# Patient Record
Sex: Female | Born: 1985
Health system: Southern US, Community
[De-identification: ages and names within clinical notes are randomized; demographics above are authoritative.]

## PROBLEM LIST (undated history)

## (undated) ENCOUNTER — Inpatient Hospital Stay: Payer: Self-pay

## (undated) DIAGNOSIS — Z87442 Personal history of urinary calculi: Secondary | ICD-10-CM

## (undated) DIAGNOSIS — F329 Major depressive disorder, single episode, unspecified: Secondary | ICD-10-CM

## (undated) DIAGNOSIS — D649 Anemia, unspecified: Secondary | ICD-10-CM

## (undated) DIAGNOSIS — K219 Gastro-esophageal reflux disease without esophagitis: Secondary | ICD-10-CM

## (undated) DIAGNOSIS — F419 Anxiety disorder, unspecified: Secondary | ICD-10-CM

## (undated) DIAGNOSIS — F32A Depression, unspecified: Secondary | ICD-10-CM

## (undated) HISTORY — PX: CHOLECYSTECTOMY, LAPAROSCOPIC: SHX56

## (undated) HISTORY — DX: Depression, unspecified: F32.A

## (undated) HISTORY — DX: Major depressive disorder, single episode, unspecified: F32.9

## (undated) HISTORY — DX: Gastro-esophageal reflux disease without esophagitis: K21.9

---
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
## 2006-03-24 ENCOUNTER — Observation Stay: Payer: Self-pay

## 2006-03-24 ENCOUNTER — Ambulatory Visit: Payer: Self-pay | Admitting: Family Medicine

## 2006-03-26 ENCOUNTER — Observation Stay: Payer: Self-pay

## 2006-03-28 ENCOUNTER — Observation Stay: Payer: Self-pay | Admitting: Obstetrics and Gynecology

## 2006-03-31 ENCOUNTER — Observation Stay: Payer: Self-pay

## 2006-04-03 ENCOUNTER — Observation Stay: Payer: Self-pay

## 2006-04-06 ENCOUNTER — Inpatient Hospital Stay: Payer: Self-pay

## 2009-02-06 ENCOUNTER — Emergency Department: Payer: Self-pay | Admitting: Emergency Medicine

## 2009-08-19 ENCOUNTER — Ambulatory Visit: Payer: Self-pay | Admitting: Family Medicine

## 2010-11-24 ENCOUNTER — Emergency Department: Payer: Self-pay | Admitting: Emergency Medicine

## 2012-03-04 ENCOUNTER — Emergency Department: Payer: Self-pay | Admitting: Emergency Medicine

## 2012-03-04 LAB — BASIC METABOLIC PANEL
Calcium, Total: 8.6 mg/dL (ref 8.5–10.1)
Chloride: 106 mmol/L (ref 98–107)
Co2: 26 mmol/L (ref 21–32)
EGFR (Non-African Amer.): >60
Osmolality: 276 (ref 275–301)
Potassium: 3.7 mmol/L (ref 3.5–5.1)

## 2012-03-04 LAB — CBC
HGB: 13 g/dL (ref 12.0–16.0)
MCH: 32.2 pg (ref 26.0–34.0)
MCHC: 34.2 g/dL (ref 32.0–36.0)
MCV: 94 fL (ref 80–100)
Platelet: 241 10*3/uL (ref 150–440)
RBC: 4.04 10*6/uL (ref 3.80–5.20)

## 2012-03-04 LAB — CK TOTAL AND CKMB (NOT AT ARMC)
CK, Total: 59 U/L (ref 21–215)
CK-MB: 0.7 ng/mL (ref 0.5–3.6)

## 2012-03-05 LAB — CK TOTAL AND CKMB (NOT AT ARMC)
CK, Total: 48 U/L (ref 21–215)
CK-MB: 0.7 ng/mL (ref 0.5–3.6)

## 2012-03-14 ENCOUNTER — Ambulatory Visit: Payer: Self-pay | Admitting: Family Medicine

## 2013-06-25 ENCOUNTER — Ambulatory Visit: Payer: Self-pay | Admitting: Physician Assistant

## 2014-04-04 NOTE — L&D Delivery Note (Signed)
Operative Delivery Note At 12:40 AM a viable female was delivered via C-Section, Low Vertical.  Presentation: vertex; Position:  Verbal consent: obtained from patient.  Risks and benefits discussed in detail.  Risks include, but are not limited to the risks of anesthesia, bleeding, infection, damage to maternal tissues, fetal cephalhematoma.  There is also the risk of inability to effect vaginal delivery of the head, or shoulder dystocia that cannot be resolved by established maneuvers, leading to the need for emergency cesarean section.  APGAR: 8, 9; weight 6 lb 12.6 oz (3080 g).   Placenta status: Intact, Manual removal.   Cord: 3 vessels with the following complications: None.  Cord pH: not done  Anesthesia: Spinal  Instruments:Episiotomy: None Lacerations: None Suture Repair: chromic vicryl Est. Blood Loss (mL): 500 IOF 1100 cc   Mom to postpartum.  Baby to to peds .  Talon Witting 02/03/2015, 1:03 AM

## 2014-07-02 ENCOUNTER — Emergency Department
Admit: 2014-07-02 | Disposition: A | Discharge status: Home / Self Care | Payer: Self-pay | Admitting: Emergency Medicine

## 2014-07-02 LAB — URINALYSIS, COMPLETE
Bilirubin,UR: NEGATIVE
Blood: NEGATIVE
Glucose,UR: NEGATIVE mg/dL (ref 0–75)
Ketone: NEGATIVE
LEUKOCYTE ESTERASE: NEGATIVE
Nitrite: NEGATIVE
Ph: 6 (ref 4.5–8.0)
Protein: NEGATIVE
Specific Gravity: 1.017 (ref 1.003–1.030)
Squamous Epithelial: 2

## 2014-07-02 LAB — COMPREHENSIVE METABOLIC PANEL
ALT: 14 U/L
Albumin: 3.8 g/dL
Alkaline Phosphatase: 49 U/L
Anion Gap: 6 — ABNORMAL LOW (ref 7–16)
BUN: 10 mg/dL
Bilirubin,Total: 0.1 mg/dL — ABNORMAL LOW
CHLORIDE: 103 mmol/L
CO2: 27 mmol/L
Calcium, Total: 9.2 mg/dL
Creatinine: 0.53 mg/dL
GLUCOSE: 118 mg/dL — AB
Potassium: 3.6 mmol/L
SGOT(AST): 16 U/L
Sodium: 136 mmol/L
Total Protein: 7.1 g/dL

## 2014-07-02 LAB — CBC WITH DIFFERENTIAL/PLATELET
BASOS ABS: 0 10*3/uL (ref 0.0–0.1)
Basophil %: 0.5 %
EOS ABS: 0.1 10*3/uL (ref 0.0–0.7)
Eosinophil %: 0.8 %
HCT: 37.9 % (ref 35.0–47.0)
HGB: 12.6 g/dL (ref 12.0–16.0)
LYMPHS ABS: 2.1 10*3/uL (ref 1.0–3.6)
LYMPHS PCT: 22.4 %
MCH: 31.4 pg (ref 26.0–34.0)
MCHC: 33.4 g/dL (ref 32.0–36.0)
MCV: 94 fL (ref 80–100)
Monocyte #: 0.6 x10 3/mm (ref 0.2–0.9)
Monocyte %: 6.7 %
NEUTROS ABS: 6.6 10*3/uL — AB (ref 1.4–6.5)
Neutrophil %: 69.6 %
Platelet: 348 10*3/uL (ref 150–440)
RBC: 4.02 10*6/uL (ref 3.80–5.20)
RDW: 12.3 % (ref 11.5–14.5)
WBC: 9.5 10*3/uL (ref 3.6–11.0)

## 2014-07-02 LAB — HCG, QUANTITATIVE, PREGNANCY: Beta Hcg, Quant.: 25350 m[IU]/mL — ABNORMAL HIGH

## 2014-12-04 ENCOUNTER — Inpatient Hospital Stay
Admission: EM | Admit: 2014-12-04 | Discharge: 2014-12-04 | Disposition: A | Discharge status: Home / Self Care | Payer: Self-pay | Attending: Obstetrics and Gynecology | Admitting: Obstetrics and Gynecology

## 2014-12-04 DIAGNOSIS — Z3A28 28 weeks gestation of pregnancy: Secondary | ICD-10-CM | POA: Insufficient documentation

## 2014-12-04 DIAGNOSIS — O26893 Other specified pregnancy related conditions, third trimester: Secondary | ICD-10-CM | POA: Insufficient documentation

## 2014-12-04 DIAGNOSIS — O368130 Decreased fetal movements, third trimester, not applicable or unspecified: Secondary | ICD-10-CM | POA: Insufficient documentation

## 2014-12-04 DIAGNOSIS — R109 Unspecified abdominal pain: Secondary | ICD-10-CM | POA: Insufficient documentation

## 2014-12-04 HISTORY — DX: Anxiety disorder, unspecified: F41.9

## 2014-12-04 LAB — URINALYSIS COMPLETE WITH MICROSCOPIC (ARMC ONLY)
BACTERIA UA: NONE SEEN
Bilirubin Urine: NEGATIVE
Glucose, UA: NEGATIVE mg/dL
Hgb urine dipstick: NEGATIVE
KETONES UR: NEGATIVE mg/dL
Leukocytes, UA: NEGATIVE
Nitrite: NEGATIVE
PH: 6 (ref 5.0–8.0)
PROTEIN: NEGATIVE mg/dL
Specific Gravity, Urine: 1.008 (ref 1.005–1.030)

## 2014-12-04 NOTE — OB Triage Note (Signed)
Pt presents to L&D with c/o tightness in abd. Occasional cramping, and back pain and decreased fetakl movement

## 2014-12-04 NOTE — OB Triage Provider Note (Signed)
History     CSN: [id number]  Arrival date and time: 12/04/14 1905   None    HPI:   29 yo G2P1 Pt. 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  Reports occasional low back pain with intermittent cramping. Pt. Is a previous c/s x 1 in 2021for breech presentation.   OB History    Gravida Para Term Preterm AB TAB SAB Ectopic Multiple Living   2         1     Past Surgical History  Procedure Laterality Date  . Cesarean section - breech presentation      History reviewed. No pertinent family history.  Social History  Substance Use Topics  . Smoking status: Not on file  . Smokeless tobacco: Not on file  . Alcohol Use: Not on file   Allergies  Allergen Reactions  . Latex Rash  . Nickel Rash    No prescriptions prior to admission    ROS: General: Denies weight loss, weight gain, fevers, fatigue Neuro: Denies HA, occasional floaters (an ongoing problem, but not bothersome or worsening), denies dizziness or blackouts Pulm: denies SOB, cough, wheezing Heart: denies chest pain, heart palpitations  Abd: + constant tightness x 1 day, denies epigastric pain, nausea, vomiting, diarrhea, +constipation - relieved with Benefiber Back: occasional low back pain GU: denies dysuria, frequency, urgency, +occasional difficulty starting stream Pelvic: denies vaginal bleeding, LOF, abnormal discharge Physical Exam   Filed Vitals:   12/04/14 1926  BP: 120/67  Pulse: 86  Temp: 97.7 F (36.5 C)  Resp: 18   Physical Exam  General: alert, calm, NAD Lungs: clear, equal bilaterally Heart: RRR Abdomen: soft, + slight tenderness RUQ with deep palpation, gravid SVE: external os FT/ internal os closed / thick / soft / mid position  Fetal monitoring: Baseline: 135bmp, moderate variability, +10x10 accels,  no decelerations Toco: occasional ctxs with uterine irritability   MAU Course  Procedures  Results for Hannah Snow, Hannah Snow (MRN [id number]) as of 12/04/2014 20:41  Ref. Range 12/04/2014 19:49  Appearance Latest Ref Range: CLEAR  CLEAR (A)  Bacteria, UA Latest Ref Range: NONE SEEN  NONE SEEN  Bilirubin Urine Latest Ref Range: NEGATIVE  NEGATIVE  Color, Urine Latest Ref Range: YELLOW  STRAW (A)  Glucose Latest Ref Range: NEGATIVE mg/dL NEGATIVE  Hgb urine dipstick Latest Ref Range: NEGATIVE  NEGATIVE  Ketones, ur Latest Ref Range: NEGATIVE mg/dL NEGATIVE  Leukocytes, UA Latest Ref Range: NEGATIVE  NEGATIVE  Mucous Unknown PRESENT  Nitrite Latest Ref Range: NEGATIVE  NEGATIVE  pH Latest Ref Range: 5.0-8.0  6.0  Protein Latest Ref Range: NEGATIVE mg/dL NEGATIVE  RBC / HPF Latest Ref Range: 0-5 RBC/hpf 0-5  Specific Gravity, Urine Latest Ref Range: 1.005-1.030  1.008  Squamous Epithelial / LPF Latest Ref Range: NONE SEEN  0-5 (A)  WBC, UA Latest Ref Range: 0-5 WBC/hpf 0-5    Assessment and Plan  IUP at 28+2weeks Category 1 fetal monitoring Tracing Irregular Contractions UA sent Prolonged fetal monitoring  Increase hydration   2126pm:  Uterine irritability and contractions resolved with hydration Fetal movement increased with meal UA - neg Category 1 Fetal Tracing Increase hydration Fetal kick counts daily 6 small frequent meals per day with protein snacks Labor s/s reviewed Instructed to call if worsening symptoms F/U  in office tomorrow for lab work and keep appointment on 12/26/14 at [facility]  Discussed plan of care with Dr. Feliberto Gottron. He agrees with plan.    Karena Addison 12/04/2014, 7:30 PM

## 2014-12-04 NOTE — Discharge Instructions (Signed)
Call provider or return to birthplace with: ° °1. Regular contractions °2. Leaking of fluid from your vaginal °3. Vaginal bleeding: Bright red or heavy like a period °4. Decreased Fetal movement ° °

## 2014-12-15 LAB — OB RESULTS CONSOLE ABO/RH

## 2015-01-26 ENCOUNTER — Observation Stay
Admission: EM | Admit: 2015-01-26 | Discharge: 2015-01-26 | Disposition: A | Discharge status: Home / Self Care | Payer: PRIVATE HEALTH INSURANCE | Attending: Obstetrics and Gynecology | Admitting: Obstetrics and Gynecology

## 2015-01-26 ENCOUNTER — Encounter: Payer: Self-pay | Admitting: *Deleted

## 2015-01-26 DIAGNOSIS — Z87891 Personal history of nicotine dependence: Secondary | ICD-10-CM | POA: Diagnosis not present

## 2015-01-26 DIAGNOSIS — Z3A35 35 weeks gestation of pregnancy: Secondary | ICD-10-CM | POA: Diagnosis not present

## 2015-01-26 DIAGNOSIS — Z91040 Latex allergy status: Secondary | ICD-10-CM | POA: Diagnosis not present

## 2015-01-26 DIAGNOSIS — Z91048 Other nonmedicinal substance allergy status: Secondary | ICD-10-CM | POA: Insufficient documentation

## 2015-01-26 DIAGNOSIS — O4703 False labor before 37 completed weeks of gestation, third trimester: Principal | ICD-10-CM | POA: Insufficient documentation

## 2015-01-26 NOTE — OB Triage Note (Signed)
Recvd to LDR 1 from ED per wheelchair.  Changed to gown and to bed.  C/O contractions that started 1500 today.  Also complains of back pain with UCs.  EFM applied.  Discussed plan of care and oriented to room.  Verbalized understanding.  Agrees with plan of care.

## 2015-01-26 NOTE — OB Triage Provider Note (Signed)
29 yo G2P1002 with EDD of 02/24/15 by LMP and correlates with 20 week US. PNC signficant for Rubella non-immune, RCS planned [facility] "UC's starting at 3pm today and pt concerned that she may be in PTL. No ROM, no VB, No increase in UC's, no LOF or decreased fM. Past Medical History  Diagnosis Date  . Anxiety   Panic attacks Past Surgical History  Procedure Laterality Date  . Cesarean section    FMH: MGM: Type 2 DM, HTN,CAD, Breast cancer PGM: CAD, HTN, DM Type 2, Breast cancer Cervical Cancer: Pat aunt, ovarian Cancer: Pat aunt Allergies  Allergen Reactions  . Latex Rash  . Nickel Rash   OB History    Gravida Para Term Preterm AB TAB SAB Ectopic Multiple Living   2         1    Gynecology: no abnormal paps Social History   Social History  . Marital Status: Single    Spouse Name: N/A  . Number of Children: N/A  . Years of Education: N/A   Occupational History  . Not on file.   Social History Main Topics  . Smoking status: Former Smoker    Quit date: 07/07/2014  . Smokeless tobacco: Not on file  . Alcohol Use: Not on file  . Drug Use: Not on file  . Sexual Activity: Yes   Other Topics Concern  . Not on file   Social History Narrative  Genetic: No Down's, no trisomies or any other genetic defects in family. O:Gen: 29 yo white female in NAD HEENT: Eyes non-icteric, Speech: clear. Heart: S1S2, RRR, No M/R/G. Lungs: CTA bilat, no W./R/R. Abd: Gravid, NT to palp. FHT: 140, reactive NST with 2 accels 15 x 15 BPM TOCO: UC's q 12-15 mins Cx: 1/long and thick/post/very high NO evidence of leaking or Vag bleeding. A: IUP at 35 6/7 weeks 2. Threatened PTL  P: Reassure pt that she is having B_H's. Rest and avoid sex for the next few days 3. FU [facility] 4. [facility]KC's A:

## 2015-02-02 ENCOUNTER — Inpatient Hospital Stay
Admission: EM | Admit: 2015-02-02 | Discharge: 2015-02-06 | DRG: 766 | Disposition: A | Discharge status: Home / Self Care | Payer: PRIVATE HEALTH INSURANCE | Attending: Obstetrics and Gynecology | Admitting: Obstetrics and Gynecology

## 2015-02-02 ENCOUNTER — Encounter
Admission: EM | Disposition: A | Discharge status: Home / Self Care | Payer: Self-pay | Source: Home / Self Care | Attending: Obstetrics and Gynecology

## 2015-02-02 DIAGNOSIS — O34211 Maternal care for low transverse scar from previous cesarean delivery: Secondary | ICD-10-CM | POA: Diagnosis present

## 2015-02-02 DIAGNOSIS — Z87891 Personal history of nicotine dependence: Secondary | ICD-10-CM | POA: Diagnosis not present

## 2015-02-02 DIAGNOSIS — Z3A36 36 weeks gestation of pregnancy: Secondary | ICD-10-CM | POA: Diagnosis not present

## 2015-02-02 DIAGNOSIS — O42913 Preterm premature rupture of membranes, unspecified as to length of time between rupture and onset of labor, third trimester: Principal | ICD-10-CM | POA: Diagnosis present

## 2015-02-02 DIAGNOSIS — Z9889 Other specified postprocedural states: Secondary | ICD-10-CM

## 2015-02-02 DIAGNOSIS — O42112 Preterm premature rupture of membranes, onset of labor more than 24 hours following rupture, second trimester: Secondary | ICD-10-CM | POA: Diagnosis present

## 2015-02-02 LAB — CBC
HEMATOCRIT: 32.9 % — AB (ref 35.0–47.0)
Hemoglobin: 11.3 g/dL — ABNORMAL LOW (ref 12.0–16.0)
MCH: 33.1 pg (ref 26.0–34.0)
MCHC: 34.4 g/dL (ref 32.0–36.0)
MCV: 96.4 fL (ref 80.0–100.0)
PLATELETS: 232 10*3/uL (ref 150–440)
RBC: 3.41 MIL/uL — AB (ref 3.80–5.20)
RDW: 13 % (ref 11.5–14.5)
WBC: 10 10*3/uL (ref 3.6–11.0)

## 2015-02-02 LAB — OB RESULTS CONSOLE VARICELLA ZOSTER ANTIBODY, IGG: VARICELLA IGG: IMMUNE

## 2015-02-02 LAB — OB RESULTS CONSOLE GBS: STREP GROUP B AG: NEGATIVE

## 2015-02-02 LAB — OB RESULTS CONSOLE ANTIBODY SCREEN: ANTIBODY SCREEN: NEGATIVE

## 2015-02-02 LAB — OB RESULTS CONSOLE ABO/RH: RH TYPE: NEGATIVE

## 2015-02-02 LAB — OB RESULTS CONSOLE HIV ANTIBODY (ROUTINE TESTING): HIV: NONREACTIVE

## 2015-02-02 LAB — OB RESULTS CONSOLE RPR: RPR: NONREACTIVE

## 2015-02-02 LAB — OB RESULTS CONSOLE GC/CHLAMYDIA
Chlamydia: NEGATIVE
Gonorrhea: NEGATIVE

## 2015-02-02 LAB — OB RESULTS CONSOLE RUBELLA ANTIBODY, IGM: Rubella: IMMUNE

## 2015-02-02 SURGERY — Surgical Case
Anesthesia: Spinal | Site: Abdomen | Wound class: Clean Contaminated

## 2015-02-02 MED ORDER — BUTORPHANOL TARTRATE 1 MG/ML IJ SOLN: 1.0000 mg | INTRAMUSCULAR | Status: DC | PRN

## 2015-02-02 MED ORDER — ONDANSETRON HCL 4 MG/2ML IJ SOLN
4.0000 mg | Freq: Four times a day (QID) | INTRAMUSCULAR | Status: DC | PRN
  Administered 2015-02-03: 4 mg via INTRAVENOUS

## 2015-02-02 MED ORDER — CITRIC ACID-SODIUM CITRATE 334-500 MG/5ML PO SOLN
30.0000 mL | ORAL | Status: DC | PRN
  Administered 2015-02-03: 30 mL via ORAL
  Filled 2015-02-02: qty 15

## 2015-02-02 MED ORDER — LACTATED RINGERS IV SOLN
INTRAVENOUS | Status: DC
  Administered 2015-02-02: via INTRAVENOUS

## 2015-02-02 MED ORDER — ACETAMINOPHEN 325 MG PO TABS: 650.0000 mg | ORAL_TABLET | ORAL | Status: DC | PRN

## 2015-02-02 MED ORDER — CEFAZOLIN SODIUM-DEXTROSE 2-3 GM-% IV SOLR
2.0000 g | Freq: Once | INTRAVENOUS | Status: AC
  Administered 2015-02-03: 2 g via INTRAVENOUS
  Filled 2015-02-02: qty 50

## 2015-02-02 MED ORDER — LIDOCAINE HCL (PF) 1 % IJ SOLN: 30.0000 mL | INTRAMUSCULAR | Status: DC | PRN

## 2015-02-02 MED ORDER — OXYTOCIN BOLUS FROM INFUSION: 500.0000 mL | INTRAVENOUS | Status: DC

## 2015-02-02 MED ORDER — LACTATED RINGERS IV SOLN
500.0000 mL | INTRAVENOUS | Status: DC | PRN
  Administered 2015-02-02: 1000 mL via INTRAVENOUS
  Administered 2015-02-03: via INTRAVENOUS

## 2015-02-02 MED ORDER — OXYTOCIN 40 UNITS IN LACTATED RINGERS INFUSION - SIMPLE MED
62.5000 mL/h | INTRAVENOUS | Status: DC
  Administered 2015-02-03: 300 mL via INTRAVENOUS
  Filled 2015-02-02: qty 1000

## 2015-02-02 SURGICAL SUPPLY — 22 items
BARRIER ADHS 3X4 INTERCEED (GAUZE/BANDAGES/DRESSINGS) ×3 IMPLANT
CANISTER SUCT 3000ML (MISCELLANEOUS) ×3 IMPLANT
CATH KIT ON-Q SILVERSOAK 5IN (CATHETERS) ×3 IMPLANT
CHLORAPREP W/TINT 26ML (MISCELLANEOUS) ×3 IMPLANT
DRSG TELFA 3X8 NADH (GAUZE/BANDAGES/DRESSINGS) ×3 IMPLANT
ELECT CAUTERY BLADE 6.4 (BLADE) ×3 IMPLANT
GAUZE SPONGE 4X4 12PLY STRL (GAUZE/BANDAGES/DRESSINGS) ×3 IMPLANT
GLOVE BIO SURGEON STRL SZ8 (GLOVE) ×3 IMPLANT
GOWN STRL REUS W/ TWL LRG LVL3 (GOWN DISPOSABLE) ×2 IMPLANT
GOWN STRL REUS W/ TWL XL LVL3 (GOWN DISPOSABLE) ×1 IMPLANT
GOWN STRL REUS W/TWL LRG LVL3 (GOWN DISPOSABLE) ×4
GOWN STRL REUS W/TWL XL LVL3 (GOWN DISPOSABLE) ×2
NS IRRIG 1000ML POUR BTL (IV SOLUTION) ×3 IMPLANT
PACK C SECTION AR (MISCELLANEOUS) ×3 IMPLANT
PAD GROUND ADULT SPLIT (MISCELLANEOUS) ×3 IMPLANT
PAD OB MATERNITY 4.3X12.25 (PERSONAL CARE ITEMS) ×3 IMPLANT
PAD PREP 24X41 OB/GYN DISP (PERSONAL CARE ITEMS) ×3 IMPLANT
SPONGE LAP 18X18 5 PK (GAUZE/BANDAGES/DRESSINGS) ×3 IMPLANT
STRAP SAFETY BODY (MISCELLANEOUS) ×3 IMPLANT
SUT CHROMIC 1 CTX 36 (SUTURE) ×9 IMPLANT
SUT PLAIN GUT 0 (SUTURE) ×6 IMPLANT
SUT VIC AB 0 CT1 36 (SUTURE) ×6 IMPLANT

## 2015-02-02 NOTE — H&P (Signed)
Hannah Snow is a 29 y.o. female presenting for PPROM at 2130 tonight . EDC 02/25/15 , EGA 36+5 weeks . Previous c/s and desires repeat . History OB History    Gravida Para Term Preterm AB TAB SAB Ectopic Multiple Living   2 1 0 0 0 0 0 0 0 1      Past Medical History  Diagnosis Date  . Anxiety    Past Surgical History  Procedure Laterality Date  . Cesarean section     1Family History: family history is not on file. Social History:  reports that she quit smoking about 6 months ago. She does not have any smokeless tobacco history on file. Her alcohol and drug histories are not on file.   Prenatal Transfer Tool  Maternal Diabetes: No Genetic Screening: Declined Maternal Ultrasounds/Referrals: Normal Fetal Ultrasounds or other Referrals:  None Maternal Substance Abuse:  No Significant Maternal Medications:  None Significant Maternal Lab Results:  None Other Comments:  None  ROS    There were no vitals taken for this visit. VSS Exam lungs cta  cv RRR without murmur  Abd: gravid soft NT  Sterile spec : ++ pooling + ferning + nitrazine  Physical Exam  Prenatal labs: ABO, Rh:  A neg Antibody:  neg  Rubella:  non immune RPR:   neg HBsAg:   neg  HIV:   neg GBS:   neg   Assessment/Plan: PPROM 36+5 weeks , prior c/s  ADmit and proceed to repeat c/s . Risks of the procedure discussed with the pt . All questions answered  She declines BTL  Declines On Q pump    Srija Southard 02/02/2015, 10:48 PM

## 2015-02-02 NOTE — Anesthesia Preprocedure Evaluation (Signed)
Anesthesia Evaluation  Patient identified by MRN, date of birth, ID band Patient awake    Reviewed: Allergy & Precautions, NPO status , Patient's Chart, lab work & pertinent test results  Airway Mallampati: II       Dental no notable dental hx.    Pulmonary former smoker,    Pulmonary exam normal        Cardiovascular negative cardio ROS Normal cardiovascular exam     Neuro/Psych    GI/Hepatic negative GI ROS, Neg liver ROS, Full stomach!   Endo/Other    Renal/GU negative Renal ROSKidney Stones  negative genitourinary   Musculoskeletal negative musculoskeletal ROS (+)   Abdominal Normal abdominal exam  (+)   Peds negative pediatric ROS (+)  Hematology negative hematology ROS (+) anemia ,   Anesthesia Other Findings   Reproductive/Obstetrics negative OB ROS                             Anesthesia Physical Anesthesia Plan  ASA: II and emergent  Anesthesia Plan: Spinal   Post-op Pain Management:    Induction:   Airway Management Planned: Nasal Cannula  Additional Equipment:   Intra-op Plan:   Post-operative Plan:   Informed Consent: I have reviewed the patients History and Physical, chart, labs and discussed the procedure including the risks, benefits and alternatives for the proposed anesthesia with the patient or authorized representative who has indicated his/her understanding and acceptance.     Plan Discussed with: CRNA  Anesthesia Plan Comments:         Anesthesia Quick Evaluation

## 2015-02-03 ENCOUNTER — Encounter: Payer: Self-pay | Admitting: *Deleted

## 2015-02-03 ENCOUNTER — Inpatient Hospital Stay: Payer: PRIVATE HEALTH INSURANCE | Admitting: Anesthesiology

## 2015-02-03 DIAGNOSIS — Z9889 Other specified postprocedural states: Secondary | ICD-10-CM

## 2015-02-03 LAB — CBC
HEMATOCRIT: 33.1 % — AB (ref 35.0–47.0)
HEMOGLOBIN: 11.6 g/dL — AB (ref 12.0–16.0)
MCH: 33.8 pg (ref 26.0–34.0)
MCHC: 35 g/dL (ref 32.0–36.0)
MCV: 96.6 fL (ref 80.0–100.0)
Platelets: 206 10*3/uL (ref 150–440)
RBC: 3.43 MIL/uL — ABNORMAL LOW (ref 3.80–5.20)
RDW: 12.9 % (ref 11.5–14.5)
WBC: 16 10*3/uL — AB (ref 3.6–11.0)

## 2015-02-03 LAB — TYPE AND SCREEN
ABO/RH(D): A NEG
ANTIBODY SCREEN: NEGATIVE

## 2015-02-03 LAB — RAPID HIV SCREEN (HIV 1/2 AB+AG)
HIV 1/2 ANTIBODIES: NONREACTIVE
HIV-1 P24 ANTIGEN - HIV24: NONREACTIVE

## 2015-02-03 LAB — ABO/RH: ABO/RH(D): A NEG

## 2015-02-03 MED ORDER — SCOPOLAMINE 1 MG/3DAYS TD PT72: 1.0000 | MEDICATED_PATCH | Freq: Once | TRANSDERMAL | Status: DC

## 2015-02-03 MED ORDER — WITCH HAZEL-GLYCERIN EX PADS: 1.0000 "application " | MEDICATED_PAD | CUTANEOUS | Status: DC | PRN

## 2015-02-03 MED ORDER — NALBUPHINE HCL 10 MG/ML IJ SOLN
5.0000 mg | Freq: Once | INTRAMUSCULAR | Status: DC | PRN
  Filled 2015-02-03: qty 0.5

## 2015-02-03 MED ORDER — FENTANYL CITRATE (PF) 100 MCG/2ML IJ SOLN
INTRAMUSCULAR | Status: AC
  Administered 2015-02-03: 25 ug via INTRAVENOUS
  Filled 2015-02-03: qty 2

## 2015-02-03 MED ORDER — MORPHINE SULFATE (PF) 0.5 MG/ML IJ SOLN
INTRAMUSCULAR | Status: DC | PRN
  Administered 2015-02-03: .2 mg via EPIDURAL

## 2015-02-03 MED ORDER — IBUPROFEN 600 MG PO TABS: 600.0000 mg | ORAL_TABLET | Freq: Four times a day (QID) | ORAL | Status: DC | PRN

## 2015-02-03 MED ORDER — ACETAMINOPHEN 325 MG PO TABS: 650.0000 mg | ORAL_TABLET | ORAL | Status: DC | PRN

## 2015-02-03 MED ORDER — SODIUM CHLORIDE 0.9 % IJ SOLN: 3.0000 mL | INTRAMUSCULAR | Status: DC | PRN

## 2015-02-03 MED ORDER — OXYCODONE-ACETAMINOPHEN 5-325 MG PO TABS: 1.0000 | ORAL_TABLET | ORAL | Status: DC | PRN

## 2015-02-03 MED ORDER — MEPERIDINE HCL 25 MG/ML IJ SOLN: 6.2500 mg | INTRAMUSCULAR | Status: DC | PRN

## 2015-02-03 MED ORDER — PRENATAL MULTIVITAMIN CH
1.0000 | ORAL_TABLET | Freq: Every day | ORAL | Status: DC
  Administered 2015-02-04 – 2015-02-05 (×2): 1 via ORAL
  Filled 2015-02-03 (×2): qty 1

## 2015-02-03 MED ORDER — IBUPROFEN 600 MG PO TABS
600.0000 mg | ORAL_TABLET | Freq: Four times a day (QID) | ORAL | Status: DC
  Administered 2015-02-04 (×2): 600 mg via ORAL
  Filled 2015-02-03: qty 1

## 2015-02-03 MED ORDER — ONDANSETRON HCL 4 MG/2ML IJ SOLN: 4.0000 mg | Freq: Three times a day (TID) | INTRAMUSCULAR | Status: DC | PRN

## 2015-02-03 MED ORDER — ONDANSETRON HCL 4 MG/2ML IJ SOLN: 4.0000 mg | Freq: Once | INTRAMUSCULAR | Status: DC | PRN

## 2015-02-03 MED ORDER — TETANUS-DIPHTH-ACELL PERTUSSIS 5-2.5-18.5 LF-MCG/0.5 IM SUSP: 0.5000 mL | Freq: Once | INTRAMUSCULAR | Status: DC

## 2015-02-03 MED ORDER — FENTANYL CITRATE (PF) 100 MCG/2ML IJ SOLN
25.0000 ug | INTRAMUSCULAR | Status: DC | PRN
  Administered 2015-02-03: 25 ug via INTRAVENOUS

## 2015-02-03 MED ORDER — BUPIVACAINE IN DEXTROSE 0.75-8.25 % IT SOLN
INTRATHECAL | Status: DC | PRN
  Administered 2015-02-03: 1.8 mL via INTRATHECAL

## 2015-02-03 MED ORDER — OXYCODONE-ACETAMINOPHEN 5-325 MG PO TABS
2.0000 | ORAL_TABLET | ORAL | Status: DC | PRN
Start: 2015-02-03 — End: 2015-02-06

## 2015-02-03 MED ORDER — DIPHENHYDRAMINE HCL 50 MG/ML IJ SOLN: 12.5000 mg | INTRAMUSCULAR | Status: DC | PRN

## 2015-02-03 MED ORDER — KETOROLAC TROMETHAMINE 30 MG/ML IJ SOLN
30.0000 mg | Freq: Four times a day (QID) | INTRAMUSCULAR | Status: DC
  Administered 2015-02-03 (×3): 30 mg via INTRAVENOUS
  Filled 2015-02-03 (×3): qty 1

## 2015-02-03 MED ORDER — SENNOSIDES-DOCUSATE SODIUM 8.6-50 MG PO TABS: 2.0000 | ORAL_TABLET | ORAL | Status: DC

## 2015-02-03 MED ORDER — MENTHOL 3 MG MT LOZG: 1.0000 | LOZENGE | OROMUCOSAL | Status: DC | PRN

## 2015-02-03 MED ORDER — NALBUPHINE HCL 10 MG/ML IJ SOLN
5.0000 mg | INTRAMUSCULAR | Status: DC | PRN
  Filled 2015-02-03: qty 0.5

## 2015-02-03 MED ORDER — LACTATED RINGERS IV SOLN
INTRAVENOUS | Status: DC
  Administered 2015-02-03: 11:00:00 via INTRAVENOUS

## 2015-02-03 MED ORDER — OXYTOCIN 40 UNITS IN LACTATED RINGERS INFUSION - SIMPLE MED: 62.5000 mL/h | INTRAVENOUS | Status: DC

## 2015-02-03 MED ORDER — SIMETHICONE 80 MG PO CHEW
80.0000 mg | CHEWABLE_TABLET | ORAL | Status: DC
  Administered 2015-02-06: 80 mg via ORAL

## 2015-02-03 MED ORDER — DIBUCAINE 1 % RE OINT: 1.0000 "application " | TOPICAL_OINTMENT | RECTAL | Status: DC | PRN

## 2015-02-03 MED ORDER — SIMETHICONE 80 MG PO CHEW: 80.0000 mg | CHEWABLE_TABLET | ORAL | Status: DC | PRN

## 2015-02-03 MED ORDER — DIPHENHYDRAMINE HCL 25 MG PO CAPS: 25.0000 mg | ORAL_CAPSULE | ORAL | Status: DC | PRN

## 2015-02-03 MED ORDER — SIMETHICONE 80 MG PO CHEW
80.0000 mg | CHEWABLE_TABLET | Freq: Three times a day (TID) | ORAL | Status: DC
  Administered 2015-02-03 – 2015-02-06 (×9): 80 mg via ORAL
  Filled 2015-02-03 (×10): qty 1

## 2015-02-03 MED ORDER — LANOLIN HYDROUS EX OINT: 1.0000 "application " | TOPICAL_OINTMENT | CUTANEOUS | Status: DC | PRN

## 2015-02-03 MED ORDER — ZOLPIDEM TARTRATE 5 MG PO TABS: 5.0000 mg | ORAL_TABLET | Freq: Every evening | ORAL | Status: DC | PRN

## 2015-02-03 MED ORDER — NALOXONE HCL 0.4 MG/ML IJ SOLN: 0.4000 mg | INTRAMUSCULAR | Status: DC | PRN

## 2015-02-03 MED ORDER — NALOXONE HCL 2 MG/2ML IJ SOSY: 1.0000 ug/kg/h | PREFILLED_SYRINGE | INTRAVENOUS | Status: DC | PRN

## 2015-02-03 NOTE — Anesthesia Postprocedure Evaluation (Signed)
  Anesthesia Post-op Note  Patient: Geni BersJessica L Stlouis  Procedure(s) Performed: Procedure(s): CESAREAN SECTION (N/A)  Anesthesia type:Spinal  Patient location: Floor  Post pain: Pain level controlled  Post assessment: Post-op Vital signs reviewed, Patient's Cardiovascular Status Stable, Respiratory Function Stable, Patent Airway and No signs of Nausea or vomiting  Post vital signs: Reviewed and stable  Last Vitals:  Filed Vitals:   02/03/15 0656  BP: 114/64  Pulse: 61  Temp: 36.8 C  Resp: 18    Level of consciousness: awake, alert  and patient cooperative  Complications: No apparent anesthesia complications

## 2015-02-03 NOTE — Plan of Care (Signed)
Problem: Consults Goal: Birthing Suites Patient Information Press F2 to bring up selections list  Outcome: Completed/Met Date Met:  02/03/15  Pt 37-[redacted] weeks EGA, repeat c-section due to SROM

## 2015-02-03 NOTE — Op Note (Signed)
NAMENAYVIE, LIPS NO.:  [id number]  MEDICAL RECORD NO.:  [id number]  LOCATION:  338A                         FACILITY:  [facility]  PHYSICIAN:  Jennell Corner, MDDATE OF BIRTH:  Aug 11, 1985  DATE OF PROCEDURE: DATE OF DISCHARGE:                              OPERATIVE REPORT   PREOPERATIVE DIAGNOSIS: 1. Preterm premature rupture of membranes. 2. A 36 plus 5 weeks' estimated gestational age. 3. Previous cesarean section.  POSTOPERATIVE DIAGNOSIS: 1. Preterm premature rupture of membranes. 2. A 36 plus 5 weeks' estimated gestational age. 3. Previous cesarean section.  PROCEDURE:  Repeat low transverse cesarean section.  ANESTHESIA:  Spinal.  SURGEON:  Jennell Corner, M.D.  INDICATION:  This is a 29 year old, gravida 2, para 1 patient, admitted with spontaneous preterm premature rupture of membranes at 36 plus 5 weeks approximately 3 hours prior to surgery.  The patient has undergone a previous cesarean section and elects for repeat cesarean section.  The patient declines permanent sterilization.  DESCRIPTION OF PROCEDURE:  After adequate spinal anesthesia, the patient was placed in dorsal supine position, hip roll on the right side.  The patient's abdomen was prepped and draped in normal sterile fashion. Time-out was performed.  A Pfannenstiel incision was made 2 fingerbreadths above the symphysis pubis.  Sharp dissection was used to identify the fascia.  Fascia was opened in the midline and opened in a transverse fashion.  The superior aspect of the fascia was grasped with Kocher clamps and the recti muscles dissected free.  The inferior aspect of the fascia was grasped with Kocher clamps and pyramidalis muscles dissected free.  Entry into the peritoneal cavity was accomplished sharply.  The vesicular uterine peritoneal fold was identified and opened in a transverse fashion.  A low transverse uterine incision was made upon entry into the  endometrial cavity.  Clear fluid resulted.  The uterine incision was extended with blunt transverse traction.  Fetal head was brought to the incision and delivered through the incision without difficulty.  The shoulders and body delivered without difficulty.  Vigorous female.  The cord was doubly clamped and passed to the pediatricians who assigned Apgar scores of 8 and 9.  The placenta was manually delivered and the uterus was exteriorized.  Endometrial cavity was wiped clean with laparotomy tape.  The cervix was opened with a ring forceps and the ring forceps was passed off the operative field. Uterine incision was closed with 1 chromic suture in a running locking fashion.  Good approximation of edges.  Good hemostasis noted. Fallopian tubes and ovaries appeared normal.  Posterior cul-de-sac was irrigated and suctioned.  Uterus was placed back into the abdominal cavity and the pericolic gutters were wiped clean with laparotomy tape. Uterine incision appeared hemostatic.  Interceed was placed over the uterine incision in a T-shaped fashion.  The fascia was closed with 0 Vicryl suture in a running, nonlocking fashion.  Good approximation of edges and good hemostasis noted.  Subcutaneous tissues were released with the Bovie and irrigated and then bovied for hemostasis.  Skin was reapproximated with staples.  There were no complications.  ESTIMATED BLOOD LOSS:  500 mL.  INTRAOPERATIVE FLUIDS:  1100 mL.  The patient was taken to  the recovery room in good condition.  The patient did receive 2 g IV Ancef prior to commencement of the case for surgical prophylaxis.          ______________________________ Jennell Cornerhomas Kerriann Kamphuis, MD     TS/MEDQ  D:  02/03/2015  T:  02/03/2015  Job:  [id number]

## 2015-02-03 NOTE — Anesthesia Procedure Notes (Addendum)
Spinal Patient location during procedure: OR Start time: 02/03/2015 12:27 AM Staffing Anesthesiologist: Marline Backbone F Performed by: anesthesiologist  Preanesthetic Checklist Completed: patient identified, site marked, surgical consent, pre-op evaluation, timeout performed, IV checked, risks and benefits discussed and monitors and equipment checked Spinal Block Patient position: sitting Prep: Betadine Patient monitoring: heart rate, continuous pulse ox, blood pressure and cardiac monitor Approach: midline Location: L4-5 Injection technique: single-shot Needle Needle type: Quincke  Needle gauge: 25 G Needle length: 9 cm Additional Notes Negative paresthesia. Negative blood return. Positive free-flowing CSF. Expiration date of kit checked and confirmed. Patient tolerated procedure well, without complications.0.2mg  astromorph

## 2015-02-03 NOTE — Progress Notes (Signed)
Patient ID: Geni BersJessica L Roberti, female   DOB: 06-22-1985, 29 y.o.   MRN: [id number] Brief op note  Preop : PPROM , elective repeat c/s  Post op : SAA Procedure repeat LTCS  Spinal  Surgeon Reesha Debes Findings : vigorous female APGARS 8/9  No complications  EBL 500 cc  IOF 1100  Pt to RR in good condition

## 2015-02-03 NOTE — Progress Notes (Signed)
Patient ID: Hannah BersJessica L Snow, female   DOB: 03-19-1986, 29 y.o.   MRN: [id number] DOS  Repeat LTCS , no problems  VSS  Good urine output  A: stable  P d/c foley  CBC in am

## 2015-02-03 NOTE — Anesthesia Post-op Follow-up Note (Signed)
  Anesthesia Pain Follow-up Note  Patient: Hannah Snow  Day #: 1  Date of Follow-up: 02/03/2015 Time: 7:13 AM  Last Vitals:  Filed Vitals:   02/03/15 0656  BP: 114/64  Pulse: 61  Temp: 36.8 C  Resp: 18    Level of Consciousness: alert  Pain: mild   Side Effects:None  Catheter Site Exam: not evaluated  Plan: D/C from anesthesia care  Clydene PughBeane, Arlen Dupuis D

## 2015-02-03 NOTE — Transfer of Care (Signed)
Immediate Anesthesia Transfer of Care Note  Patient: Hannah Snow  Procedure(s) Performed: Procedure(s): CESAREAN SECTION (N/A)  Patient Location: PACU  Anesthesia Type:Spinal  Level of Consciousness: awake, alert  and oriented  Airway & Oxygen Therapy: Patient Spontanous Breathing  Post-op Assessment: Report given to RN and Post -op Vital signs reviewed and stable  Post vital signs: Reviewed and stable  Last Vitals:  Filed Vitals:   02/02/15 2358  BP: 131/74  Pulse: 84  Temp: 37 C  Resp: 20    Complications: No apparent anesthesia complications

## 2015-02-04 LAB — RPR: RPR Ser Ql: NONREACTIVE

## 2015-02-04 MED ORDER — IBUPROFEN 600 MG PO TABS
600.0000 mg | ORAL_TABLET | Freq: Four times a day (QID) | ORAL | Status: DC
  Administered 2015-02-04 – 2015-02-06 (×7): 600 mg via ORAL
  Filled 2015-02-04 (×8): qty 1

## 2015-02-04 MED ORDER — GUAIFENESIN-DM 100-10 MG/5ML PO SYRP
5.0000 mL | ORAL_SOLUTION | ORAL | Status: DC | PRN
  Administered 2015-02-04: 5 mL via ORAL
  Filled 2015-02-04 (×2): qty 5

## 2015-02-04 NOTE — Lactation Note (Signed)
This note was copied from the chart of Hannah Sandy SalaamJessica Maselli. Lactation Consultation Note  Patient Name: Hannah Snow AVWUJ'WToday's Date: 02/04/2015 Reason for consult: Follow-up assessment   Maternal Data  Mom states that the 20 mm nipple shield helped her nurse her baby well yesterday, but [name] got "fussy" last night and would not nurse well with or without shield. She has a red positional stripe across face of right nipple. I tried 24 mm shield which mom says feels better, but [name] still lost interest in feeding.  Feeding Feeding Type: Breast Fed [name] is very fussy today. Has moist mouth, but dry lips. Since Mom has not heard her swallow during last feedings and we can't get her interested in nursing well now, Mom had massage and is pumping/hand expressing to get colostrum.  LATCH Score/Interventions Latch: Repeated attempts needed to sustain latch, nipple held in mouth throughout feeding, stimulation needed to elicit sucking reflex. Intervention(s): Skin to skin (also tried nipple shield 20 and 24 mm)  Audible Swallowing: None Intervention(s):  (Will have mom pump and hand express)  Type of Nipple: Flat (flat when compressed a bit)  Comfort (Breast/Nipple): Filling, red/small blisters or bruises, mild/mod discomfort  Problem noted: Mild/Moderate discomfort  Hold (Positioning): No assistance needed to correctly position infant at breast.  LATCH Score: 5  Lactation Tools Discussed/Used Tools: Nipple [name] Nipple shield size: 24   Consult Status      Sunday CornSandra Clark Jahni Nazar 02/04/2015, 12:32 PM

## 2015-02-04 NOTE — Lactation Note (Signed)
This note was copied from the chart of Hannah Sandy SalaamJessica Stranahan. Lactation Consultation Note  Patient Name: Hannah Snow BJYNW'GToday's Date: 02/04/2015 Reason for consult: Difficult latch   Maternal Data   Mom states the 20 mm NS now feels better. The 24 mm allowed nipple to get compressed.  Feeding Feeding Type: Breast Milk with Formula added  Baby latched to breast with 20 mm NS and some formula inserted into shield. It helped her to develop rhythmic suck/swallow pattern even when shield was empty. She took 10 ml Similac that way. Several voids and stools.  I asked her to have MD check frenulum over time. It is a borderline tie. Tongue can extend past gumline; I see it under breast during feeding; she can feed well enough to NOT compress nipple; Tongue curves around finger during exam. PLAN: Mom to use NS over night (at least); may use formula in shield to coax nursing; pump 15 minutes any time she does not nurse well on a breast and at elast a few times a day regardless of feeds as long as she uses nipple Shield.  LATCH Score/Interventions Latch: Grasps breast easily, tongue down, lips flanged, rhythmical sucking. (with 20 mm NS)  Audible Swallowing: Spontaneous and intermittent (with formula in shield and then when shield empty)  Type of Nipple: Flat  Comfort (Breast/Nipple): Filling, red/small blisters or bruises, mild/mod discomfort  Problem noted: Mild/Moderate discomfort Interventions (Mild/moderate discomfort): Comfort gels  Hold (Positioning): No assistance needed to correctly position infant at breast.  LATCH Score: 8  Lactation Tools Discussed/Used Tools: Nipple Hannah Snow Nipple shield size: 20 (this size works best for mom)   Consult Status      Hannah Snow 02/04/2015, 5:04 PM

## 2015-02-04 NOTE — Progress Notes (Signed)
Post Partum Day 2 Subjective: no complaints  Objective: Blood pressure 115/68, pulse 85, temperature 98 F (36.7 C), temperature source Oral, resp. rate 20, height 5\' 6"  (1.676 m), weight 180 lb (81.647 kg), SpO2 100 %, unknown if currently breastfeeding.  Physical Exam:  General: alert and cooperative  Lungs CTA  CV RRR  Lochia: appropriate Uterine Fundus: firm Incision: healing well DVT Evaluation: No evidence of DVT seen on physical exam.   Recent Labs  02/02/15 2257 02/03/15 0622  HGB 11.3* 11.6*  HCT 32.9* 33.1*    Assessment/Plan: Plan for discharge tomorrow, pm d/c with staples out    LOS: 2 days   Hannah Snow 02/04/2015, 8:22 AM

## 2015-02-05 MED ORDER — HYDROCODONE-ACETAMINOPHEN 5-300 MG PO TABS: 1.0000 | ORAL_TABLET | ORAL | Status: DC | PRN

## 2015-02-05 NOTE — Discharge Instructions (Signed)
Obstetric Discharge Summary   Patient ID: Hannah Snow MRN: [id number] DOB/AGE: 10-23-1985 29 y.o.   Date of Admission: 02/02/2015  Date of Discharge: 02/05/15  The patient was admitted on the day of  surgery and proceeded to the operating room as scheduled for the below stated procedure without complications,  (For complete operative information, please see operative report).  Postoperatively she was admitted to the floor. Pain was initially managed with PCA which was transitioned to PO  meds when the patient was tolerating a regular diet on postoperative day number 1.  Postoperative labs were stable.   Foley cath was discontinued on postoperative day number 1 and patient passed a voiding trial without difficulty.   Patient was felt to be stable for discharge on postoperative day number 3  when she was tolerating a regular diet, pain was controlled with po pain medications, and she was ambulating and voiding without difficulty. Vital signs were stable and physical exam remained benign throughout her hospital stay. Incision was clean,dry, and intact with staples.  She will follow up per below for post-op check .Marland Kitchen.  Rx given for Vicodin,  and Colace  OTC.Marland Kitchen.    She was given specific instructions and numbers to call in written and verbal format. She verbalized understanding, agrees with the plan of care, and all questions answered to her satisfaction.

## 2015-02-05 NOTE — Discharge Summary (Signed)
Obstetric Discharge Summary   Patient ID: Hannah Snow Stuhr MRN: [id number] DOB/AGE: [birth year] 28 y.o.   Date of Admission: 02/02/2015  Date of Discharge: 02/05/15  Admitting Diagnosis: Scheduled cesarean section at [id number]  Secondary Diagnosis: None  Mode of Delivery: repeat cesarean section     Discharge Diagnosis: RCS WITH PPROM  Intrapartum Procedures: Continous fetal and uterine monitor   Post partum procedures: None  Complications: None   Brief Hospital Course   (Cesarean Section): Hannah Snow Weng is a [id number] who underwent cesarean section on 02/02/2015 - 02/03/2015.  Patient had an uncomplicated surgery; for further details of this surgery, please refer to the operative note.  Patient had an uncomplicated postpartum course.  By time of discharge on POD#3, her pain was controlled on oral pain medications; she had appropriate lochia and was ambulating, voiding without difficulty, tolerating regular diet and passing flatus.   She was deemed stable for discharge to home.    Labs: CBC Latest Ref Rng 02/03/2015 02/02/2015 07/02/2014  WBC 3.6 - 11.0 K/uL 16.0(H) 10.0 9.5  Hemoglobin 12.0 - 16.0 g/dL 11.6(Snow) 11.3(Snow) 12.6  Hematocrit 35.0 - 47.0 % 33.1(Snow) 32.9(Snow) 37.9  Platelets 150 - 440 K/uL 206 232 348   A NEG  Physical exam:  Blood pressure 121/71, pulse 86, temperature 98.6 F (37 C), temperature source Oral, resp. rate 18, height 5\' 6"  (1.676 m), weight 180 lb (81.647 kg), SpO2 99 %, unknown if currently breastfeeding. General: alert and no distress Heart: S1S2, RRR, No M/R/G. Lungs: CTA bilat. No  W/R/R Lochia: appropriate Abdomen: soft, NT Uterine Fundus: firm Incision: healing well, no significant drainage, no dehiscence, no significant erythema, staples intact Extremities: No evidence of DVT seen on physical exam. No lower extremity edema.  Discharge Instructions: Per After Visit Summary. Activity: Advance as tolerated. Pelvic rest for 6 weeks.  Also refer  to After Visit Summary Diet: Regular Medications:   Medication List    ASK your doctor about these medications        ferrous sulfate 325 (65 FE) MG tablet  Take 325 mg by mouth daily with breakfast.     prenatal vitamin w/FE, FA 27-1 MG Tabs tablet  Take 1 tablet by mouth daily at 12 noon.       Outpatient follow up:  Postpartum contraception: no method  Discharged Condition: stable  Discharged to: home   Newborn Data:   Disposition:home with mother  Apgars: APGAR (1 MIN): 8   APGAR (5 MINS): 9   APGAR (10 MINS):    Baby Feeding: Breast  Sharee Pimplearon W Ottilie Wigglesworth, CNM 02/05/2015

## 2015-02-06 ENCOUNTER — Inpatient Hospital Stay: Admission: RE | Admit: 2015-02-06 | Payer: No Typology Code available for payment source | Source: Ambulatory Visit

## 2015-02-06 NOTE — Progress Notes (Addendum)
POSTOPERATIVE DAY #  4 S/P Repeat C/S   S:         Reports feeling good and ready to go home today / baby was having trouble swallowing and peds decided to keep her 1 more night              Tolerating po intake / no nausea / no vomiting / + flatus / + BM x3              Bleeding is light             Pain controlled with Motrin             Up ad lib / ambulatory/ voiding QS  Newborn breast feeding     O:  VS: BP 103/71 mmHg  Pulse 79  Temp(Src) 98.9 F (37.2 C) (Oral)  Resp 20  Ht 5\' 6"  (1.676 m)  Wt 81.647 kg (180 lb)  BMI 29.07 kg/m2  SpO2 99%  Breastfeeding? Unknown   LABS:              No results for input(s): WBC, HGB, PLT in the last 72 hours.             Bloodtype: --/--/A NEG (11/01 0950)  Rubella: Immune (10/31 0000)                                                       Physical Exam:             Alert and Oriented X3  Lungs: Clear and unlabored  Heart: regular rate and rhythm / no mumurs  Abdomen: soft, non-tender, non-distended, + bowel sounds in all quadrants             Fundus: firm, non-tender, U-2              Incision:  approximated with staples/ no erythema / no ecchymosis / no drainage  Perineum: intact  Lochia: scant  Extremities: no edema, no calf pain or tenderness, negative Homans  A:        POD # 4 S/P Repeat C/S             RH Negative - baby O Negative - no rhogam indicated   P:        Discharge home today             Discharge instructions reviewed with patient - no driving for 2 weeks, pelvic rest x 6 weeks   D/C staples prior to discharge  Pt. Already has orders for medications for discharge   [facility] for post-op check with Dr. Otilio CarpenSchermerhorn   Hannah Snow C Selim Durden, CNM

## 2015-02-06 NOTE — Progress Notes (Signed)
Staples removed prior to discharge. Discharge instructions complete and prescription given. Patient discharged home at 1050.

## 2015-02-19 ENCOUNTER — Other Ambulatory Visit: Payer: No Typology Code available for payment source

## 2015-02-20 ENCOUNTER — Inpatient Hospital Stay
Admission: RE | Admit: 2015-02-20 | Payer: No Typology Code available for payment source | Source: Ambulatory Visit | Admitting: Obstetrics and Gynecology

## 2015-02-20 ENCOUNTER — Encounter: Admission: RE | Payer: Self-pay | Source: Ambulatory Visit

## 2015-02-20 SURGERY — Surgical Case
Anesthesia: Choice

## 2016-05-23 ENCOUNTER — Encounter: Payer: Self-pay | Admitting: Family Medicine

## 2016-05-23 ENCOUNTER — Ambulatory Visit (INDEPENDENT_AMBULATORY_CARE_PROVIDER_SITE_OTHER): Payer: 59 | Admitting: Family Medicine

## 2016-05-23 DIAGNOSIS — J209 Acute bronchitis, unspecified: Secondary | ICD-10-CM | POA: Diagnosis not present

## 2016-05-23 MED ORDER — DOXYCYCLINE HYCLATE 100 MG PO TABS: 100.0000 mg | ORAL_TABLET | Freq: Two times a day (BID) | ORAL | 0 refills | Status: DC

## 2016-05-23 MED ORDER — HYDROCOD POLST-CPM POLST ER 10-8 MG/5ML PO SUER: 5.0000 mL | Freq: Two times a day (BID) | ORAL | 0 refills | Status: DC | PRN

## 2016-05-23 NOTE — Progress Notes (Signed)
Subjective:  Patient ID: Hannah Snow, female    DOB: 1986-03-29  Age: 31 y.o. MRN: [id number]  CC: Cough, congestion   HPI Hannah Snow is a 31 y.o. female presents to the clinic today with the above complaints.  Patient has been sick since Friday. She reports productive cough. Sinus pressure and congestion. No associated fever. No medications tried. No known exacerbating or relieving factors. Symptoms are severe. Children with similar illness. She is concerned that she may have a sinus infection.  PMH, Surgical Hx, Family Hx, Social History reviewed and updated as below.  Past Medical History:  Diagnosis Date  . Anxiety   . Depression   . GERD (gastroesophageal reflux disease)    Past Surgical History:  Procedure Laterality Date  . CESAREAN SECTION    . CESAREAN SECTION N/A 02/02/2015   Procedure: CESAREAN SECTION;  Surgeon: Suzy Bouchard, MD;  Location: [facility] ORS;  Service: Obstetrics;  Laterality: N/A;   Family History  Problem Relation Age of Onset  . Hyperlipidemia Father   . Heart disease Father   . Hypertension Father   . Mental illness Father   . Diabetes Father   . Mental illness Sister   . Mental illness Brother   . Breast cancer Maternal Grandmother   . Lung cancer Maternal Grandmother   . Breast cancer Paternal Grandmother   . Mental illness Sister    Social History  Substance Use Topics  . Smoking status: Former Smoker    Quit date: 07/07/2014  . Smokeless tobacco: Never Used  . Alcohol use No   Review of Systems  Constitutional: Negative for fever.  HENT: Positive for congestion and sinus pressure.   Respiratory: Positive for cough.   All other systems reviewed and are negative.  Objective:   Today's Vitals: BP 102/70   Pulse (!) 105   Temp 97.8 F (36.6 C) (Oral)   Ht 5' 5.5" (1.664 m)   Wt 142 lb 12.8 oz (64.8 kg)   SpO2 100%   Breastfeeding? No   BMI 23.40 kg/m   Physical Exam  Constitutional: She is oriented to  person, place, and time. She appears well-developed.  Appears fatigued. NAD.   HENT:  Head: Normocephalic and atraumatic.  Mouth/Throat: Oropharynx is clear and moist.  Eyes: Conjunctivae are normal.  Neck: Neck supple.  Cardiovascular: Normal rate and regular rhythm.   Pulmonary/Chest: Effort normal and breath sounds normal.  Abdominal: She exhibits no distension.  Musculoskeletal: Normal range of motion.  Lymphadenopathy:    She has no cervical adenopathy.  Neurological: She is alert and oriented to person, place, and time.  Psychiatric: She has a normal mood and affect.  Vitals reviewed.  Assessment & Plan:   Problem List Items Addressed This Visit    Acute bronchitis    New problem. Treating with Doxy and Tussionex.        Outpatient Encounter Prescriptions as of 05/23/2016  Medication Sig  . COPPER PO by Intrauterine route.  . chlorpheniramine-HYDROcodone (TUSSIONEX PENNKINETIC ER) 10-8 MG/5ML SUER Take 5 mLs by mouth every 12 (twelve) hours as needed.  . doxycycline (VIBRA-TABS) 100 MG tablet Take 1 tablet (100 mg total) by mouth 2 (two) times daily.  . [DISCONTINUED] ferrous sulfate 325 (65 FE) MG tablet Take 325 mg by mouth daily with breakfast.  . [DISCONTINUED] Hydrocodone-Acetaminophen (VICODIN) 5-300 MG TABS Take 1 tablet by mouth every 4 (four) hours as needed.  . [DISCONTINUED] prenatal vitamin w/FE, FA (PRENATAL 1 + 1)  27-1 MG TABS tablet Take 1 tablet by mouth daily at 12 noon.   No facility-administered encounter medications on file as of 05/23/2016.     Follow-up: PRN  Everlene OtherJayce Deni Lefever DO [facility] [place]

## 2016-05-23 NOTE — Progress Notes (Signed)
Pre visit review using our clinic review tool, if applicable. No additional management support is needed unless otherwise documented below in the visit note. 

## 2016-05-23 NOTE — Patient Instructions (Signed)

## 2016-05-23 NOTE — Assessment & Plan Note (Signed)
New problem. Treating with Doxy and Tussionex.

## 2016-10-04 ENCOUNTER — Ambulatory Visit (INDEPENDENT_AMBULATORY_CARE_PROVIDER_SITE_OTHER): Payer: 59 | Admitting: Family

## 2016-10-04 VITALS — BP 104/56 | HR 86 | Temp 98.2°F | Ht 65.5 in | Wt 144.2 lb

## 2016-10-04 DIAGNOSIS — R0981 Nasal congestion: Secondary | ICD-10-CM

## 2016-10-04 MED ORDER — BENZONATATE 100 MG PO CAPS: 100.0000 mg | ORAL_CAPSULE | Freq: Two times a day (BID) | ORAL | 0 refills | Status: DC | PRN

## 2016-10-04 MED ORDER — AMOXICILLIN 500 MG PO CAPS: 500.0000 mg | ORAL_CAPSULE | Freq: Two times a day (BID) | ORAL | 0 refills | Status: DC

## 2016-10-04 NOTE — Progress Notes (Signed)
Subjective:    Patient ID: Hannah Snow, female    DOB: 1985-07-04, 31 y.o.   MRN: [id number]  CC: Hannah Snow is a 31 y.o. female who presents today for an acute visit.    HPI: CC: sinus congestion x 3 days, worsening.   Thick green congestion. Hasn't tried any medication OTC.   Endorses nasal congestion, HA 'pressure'. Not worse HA of life.   2 year daughter has had similar symptoms  No fever, sob, chills, vision changes.  No h/o asthma  smoker  H/o seasonal allergies. No medications.       HISTORY:  Past Medical History:  Diagnosis Date  . Anxiety   . Depression   . GERD (gastroesophageal reflux disease)    Past Surgical History:  Procedure Laterality Date  . CESAREAN SECTION    . CESAREAN SECTION N/A 02/02/2015   Procedure: CESAREAN SECTION;  Surgeon: Suzy Bouchardhomas J Schermerhorn, MD;  Location: [facility] ORS;  Service: Obstetrics;  Laterality: N/A;   Family History  Problem Relation Age of Onset  . Hyperlipidemia Father   . Heart disease Father   . Hypertension Father   . Mental illness Father   . Diabetes Father   . Mental illness Sister   . Mental illness Brother   . Breast cancer Maternal Grandmother   . Lung cancer Maternal Grandmother   . Breast cancer Paternal Grandmother   . Mental illness Sister     Allergies: Latex and Nickel Current Outpatient Prescriptions on File Prior to Visit  Medication Sig Dispense Refill  . chlorpheniramine-HYDROcodone (TUSSIONEX PENNKINETIC ER) 10-8 MG/5ML SUER Take 5 mLs by mouth every 12 (twelve) hours as needed. 115 mL 0  . COPPER PO by Intrauterine route.    . doxycycline (VIBRA-TABS) 100 MG tablet Take 1 tablet (100 mg total) by mouth 2 (two) times daily. 14 tablet 0   No current facility-administered medications on file prior to visit.     Social History  Substance Use Topics  . Smoking status: Current Every Day Smoker    Last attempt to quit: 07/07/2014  . Smokeless tobacco: Never Used  . Alcohol use  No    Review of Systems  Constitutional: Negative for chills and fever.  HENT: Positive for congestion, sinus pain and sinus pressure.   Respiratory: Positive for cough.   Cardiovascular: Negative for chest pain and palpitations.  Gastrointestinal: Negative for nausea and vomiting.      Objective:    BP (!) 104/56   Pulse 86   Temp 98.2 F (36.8 C) (Oral)   Ht 5' 5.5" (1.664 m)   Wt 144 lb 3.2 oz (65.4 kg)   SpO2 98%   BMI 23.63 kg/m    Physical Exam  Constitutional: She appears well-developed and well-nourished.  HENT:  Head: Normocephalic and atraumatic.  Right Ear: Hearing, tympanic membrane, external ear and ear canal normal. No drainage, swelling or tenderness. No foreign bodies. Tympanic membrane is not erythematous and not bulging. No middle ear effusion. No decreased hearing is noted.  Left Ear: Hearing, tympanic membrane, external ear and ear canal normal. No drainage, swelling or tenderness. No foreign bodies. Tympanic membrane is not erythematous and not bulging.  No middle ear effusion. No decreased hearing is noted.  Nose: Nose normal. No rhinorrhea. Right sinus exhibits no maxillary sinus tenderness and no frontal sinus tenderness. Left sinus exhibits no maxillary sinus tenderness and no frontal sinus tenderness.  Mouth/Throat: Uvula is midline and mucous membranes are normal. Posterior oropharyngeal  erythema present. No oropharyngeal exudate, posterior oropharyngeal edema or tonsillar abscesses.  Tonsils +2/4  Eyes: Conjunctivae are normal.  Cardiovascular: Regular rhythm, normal heart sounds and normal pulses.   Pulmonary/Chest: Effort normal and breath sounds normal. She has no wheezes. She has no rhonchi. She has no rales.  Lymphadenopathy:       Head (right side): No submental, no submandibular, no tonsillar, no preauricular, no posterior auricular and no occipital adenopathy present.       Head (left side): No submental, no submandibular, no tonsillar, no  preauricular, no posterior auricular and no occipital adenopathy present.    She has no cervical adenopathy.  Neurological: She is alert.  Skin: Skin is warm and dry.  Psychiatric: She has a normal mood and affect. Her speech is normal and behavior is normal. Thought content normal.  Vitals reviewed.      Assessment & Plan:   1. Sinus congestion Afebrile. Well appearing. Duration 3 days. Suspect viral. Strep negative. We agreed conservative management. Tessalon, mucinex. Since holiday weekend, gave rx for amoxicillin if symptoms were to worsen. Discussed smoking cessation; politely declined starting medication at this time as had quit without in the past.   - benzonatate (TESSALON) 100 MG capsule; Take 1 capsule (100 mg total) by mouth 2 (two) times daily as needed for cough.  Dispense: 20 capsule; Refill: 0 - amoxicillin (AMOXIL) 500 MG capsule; Take 1 capsule (500 mg total) by mouth 2 (two) times daily.  Dispense: 14 capsule; Refill: 0    I am having Hannah Snow start on benzonatate and amoxicillin. I am also having her maintain her COPPER PO, doxycycline, and chlorpheniramine-HYDROcodone.   Meds ordered this encounter  Medications  . benzonatate (TESSALON) 100 MG capsule    Sig: Take 1 capsule (100 mg total) by mouth 2 (two) times daily as needed for cough.    Dispense:  20 capsule    Refill:  0    Order Specific Question:   Supervising Provider    Answer:   Duncan Dull L [[id number]]  . amoxicillin (AMOXIL) 500 MG capsule    Sig: Take 1 capsule (500 mg total) by mouth 2 (two) times daily.    Dispense:  14 capsule    Refill:  0    Order Specific Question:   Supervising Provider    Answer:   Sherlene Shams [[id number]]    Return precautions given.   Risks, benefits, and alternatives of the medications and treatment plan prescribed today were discussed, and patient expressed understanding.   Education regarding symptom management and diagnosis given to patient on AVS.  Continue  to follow with Tommie Sams, DO for routine health maintenance.   Hannah Bers and I agreed with plan.   Hannah Plowman, FNP

## 2016-10-04 NOTE — Patient Instructions (Signed)
Suspect viral  mucinex- plenty of water  Steam showers  Tessalon as needed   Start amoxicillin after 2-3 days of symptom management if no improvement  Let me know if not better

## 2016-10-04 NOTE — Progress Notes (Signed)
Pre visit review using our clinic review tool, if applicable. No additional management support is needed unless otherwise documented below in the visit note. 

## 2016-10-26 ENCOUNTER — Encounter: Payer: Self-pay | Admitting: Family

## 2016-10-26 ENCOUNTER — Ambulatory Visit (INDEPENDENT_AMBULATORY_CARE_PROVIDER_SITE_OTHER): Payer: 59 | Admitting: Family

## 2016-10-26 VITALS — BP 108/78 | HR 79 | Temp 97.5°F | Ht 65.5 in | Wt 145.0 lb

## 2016-10-26 DIAGNOSIS — R1011 Right upper quadrant pain: Secondary | ICD-10-CM | POA: Insufficient documentation

## 2016-10-26 LAB — COMPREHENSIVE METABOLIC PANEL
ALK PHOS: 52 U/L (ref 39–117)
ALT: 11 U/L (ref 0–35)
AST: 12 U/L (ref 0–37)
Albumin: 4 g/dL (ref 3.5–5.2)
BUN: 8 mg/dL (ref 6–23)
CO2: 27 mEq/L (ref 19–32)
CREATININE: 0.74 mg/dL (ref 0.40–1.20)
Calcium: 9.1 mg/dL (ref 8.4–10.5)
Chloride: 105 mEq/L (ref 96–112)
GFR: 98.04 mL/min (ref >60.00)
GLUCOSE: 100 mg/dL — AB (ref 70–99)
POTASSIUM: 3.9 meq/L (ref 3.5–5.1)
SODIUM: 135 meq/L (ref 135–145)
Total Bilirubin: 0.3 mg/dL (ref 0.2–1.2)
Total Protein: 7.1 g/dL (ref 6.0–8.3)

## 2016-10-26 LAB — LIPASE: Lipase: 21 U/L (ref 11.0–59.0)

## 2016-10-26 NOTE — Patient Instructions (Addendum)
US Thursday afternoon.   Labs today  Trial of benefiber or metamucil. Increase water.   Trial of zantac 150mg  BID 1 hour before meals - perhaps lunch and dinner. Aim for earlier dinner and not to lay down right after.   Try to squeeze in a little walking as a busy working [employer]   Food Choices for Gastroesophageal Reflux Disease, Adult When you have gastroesophageal reflux disease (GERD), the foods you eat and your eating habits are very important. Choosing the right foods can help ease your discomfort. What guidelines do I need to follow?  Choose fruits, vegetables, whole grains, and low-fat dairy products.  Choose low-fat meat, fish, and poultry.  Limit fats such as oils, salad dressings, butter, nuts, and avocado.  Keep a food diary. This helps you identify foods that cause symptoms.  Avoid foods that cause symptoms. These may be different for everyone.  Eat small meals often instead of 3 large meals a day.  Eat your meals slowly, in a place where you are relaxed.  Limit fried foods.  Cook foods using methods other than frying.  Avoid drinking alcohol.  Avoid drinking large amounts of liquids with your meals.  Avoid bending over or lying down until 2-3 hours after eating. What foods are not recommended? These are some foods and drinks that may make your symptoms worse: Vegetables Tomatoes. Tomato juice. Tomato and spaghetti sauce. Chili peppers. Onion and garlic. Horseradish. Fruits Oranges, grapefruit, and lemon (fruit and juice). Meats High-fat meats, fish, and poultry. This includes hot dogs, ribs, ham, sausage, salami, and bacon. Dairy Whole milk and chocolate milk. Sour cream. Cream. Butter. Ice cream. Cream cheese. Drinks Coffee and tea. Bubbly (carbonated) drinks or energy drinks. Condiments Hot sauce. Barbecue sauce. Sweets/Desserts Chocolate and cocoa. Donuts. Peppermint and spearmint. Fats and Oils High-fat foods. This includes Jamaica[place] fries and  potato chips. Other Vinegar. Strong spices. This includes black pepper, white pepper, red pepper, cayenne, curry powder, cloves, ginger, and chili powder. The items listed above may not be a complete list of foods and drinks to avoid. Contact your dietitian for more information. This information is not intended to replace advice given to you by your health care provider. Make sure you discuss any questions you have with your health care provider. Document Released: 09/20/2011 Document Revised: 08/27/2015 Document Reviewed: 01/23/2013 Elsevier Interactive Patient Education  2017 [employer]

## 2016-10-26 NOTE — Assessment & Plan Note (Signed)
Patient is afebrile and well-appearing. Negative Murphy sign on exam today. Differentials include gallstones, gallbladder sludge , GERD, constipation. Education provided on high fiber diet, and trial of zantac bid. Pending labs, abdominal US.

## 2016-10-26 NOTE — Progress Notes (Signed)
Subjective:    Patient ID: Hannah BersJessica L Salceda, female    DOB: 1986-01-02, 31 y.o.   MRN: [id number]  CC: Hannah Snow is a 31 y.o. female who presents today for an acute visit.    HPI: CC: ruq pain intermittent pain , one month, unchanged. Feels 'like something there.' Describes as 'uncomfortable' versus pain.   Noticed more after eating, feels like indigestion, bloating, belching, gas.  Last BM yetserday  Epigastric burning with pizza  No n, v, fever, cp, sob,   Has copper IUD, normal cycles. Thinks starting period today. Declines pregnancy test.   2 pepsi's         HISTORY:  Past Medical History:  Diagnosis Date  . Anxiety   . Depression   . GERD (gastroesophageal reflux disease)    Past Surgical History:  Procedure Laterality Date  . CESAREAN SECTION    . CESAREAN SECTION N/A 02/02/2015   Procedure: CESAREAN SECTION;  Surgeon: Suzy Bouchardhomas J Schermerhorn, MD;  Location: [facility] ORS;  Service: Obstetrics;  Laterality: N/A;   Family History  Problem Relation Age of Onset  . Hyperlipidemia Father   . Heart disease Father   . Hypertension Father   . Mental illness Father   . Diabetes Father   . Mental illness Sister   . Mental illness Brother   . Breast cancer Maternal Grandmother   . Lung cancer Maternal Grandmother   . Breast cancer Paternal Grandmother   . Mental illness Sister     Allergies: Latex and Nickel No current outpatient prescriptions on file prior to visit.   No current facility-administered medications on file prior to visit.     Social History  Substance Use Topics  . Smoking status: Current Every Day Smoker    Last attempt to quit: 07/07/2014  . Smokeless tobacco: Never Used  . Alcohol use No    Review of Systems  Constitutional: Negative for chills and fever.  Respiratory: Negative for cough.   Cardiovascular: Negative for chest pain and palpitations.  Gastrointestinal: Positive for abdominal distention. Negative for abdominal  pain, constipation, diarrhea, nausea and vomiting.      Objective:    BP 108/78   Pulse 79   Temp (!) 97.5 F (36.4 C)   Ht 5' 5.5" (1.664 m)   Wt 145 lb (65.8 kg)   SpO2 97%   BMI 23.76 kg/m    Physical Exam  Constitutional: She appears well-developed and well-nourished.  Eyes: Conjunctivae are normal.  Cardiovascular: Normal rate, regular rhythm, normal heart sounds and normal pulses.   Pulmonary/Chest: Effort normal and breath sounds normal. She has no wheezes. She has no rhonchi. She has no rales.  Abdominal: Soft. Normal appearance and bowel sounds are normal. She exhibits no distension, no fluid wave, no ascites and no mass. There is no tenderness. There is no rigidity, no rebound, no guarding, no CVA tenderness, no tenderness at McBurney's point and negative Murphy's sign.  Neurological: She is alert.  Skin: Skin is warm and dry.  Psychiatric: She has a normal mood and affect. Her speech is normal and behavior is normal. Thought content normal.  Vitals reviewed.      Assessment & Plan:   Problem List Items Addressed This Visit      Other   RUQ pain - Primary    Patient is afebrile and well-appearing. Negative Murphy sign on exam today. Differentials include gallstones, gallbladder sludge , GERD, constipation. Education provided on high fiber diet, and trial of zantac  bid. Pending labs, abdominal US.       Relevant Orders   US Abdomen Complete   Comprehensive metabolic panel   Lipase        I have discontinued Ms. Furth's COPPER PO, doxycycline, chlorpheniramine-HYDROcodone, benzonatate, and amoxicillin. I am also having her maintain her PARAGARD INTRAUTERINE COPPER.   Meds ordered this encounter  Medications  . PARAGARD INTRAUTERINE COPPER IUD IUD    Sig: 1 each by Intrauterine route once.    Return precautions given.   Risks, benefits, and alternatives of the medications and treatment plan prescribed today were discussed, and patient expressed  understanding.   Education regarding symptom management and diagnosis given to patient on AVS.  Continue to follow with Tommie Samsook, Jayce G, DO for routine health maintenance.   Hannah BersJessica L Cafaro and I agreed with plan.   Rennie PlowmanMargaret Eulalia Ellerman, FNP

## 2016-10-26 NOTE — Progress Notes (Signed)
Pre visit review using our clinic review tool, if applicable. No additional management support is needed unless otherwise documented below in the visit note. 

## 2016-11-01 ENCOUNTER — Ambulatory Visit
Admission: RE | Admit: 2016-11-01 | Discharge: 2016-11-01 | Disposition: A | Discharge status: Home / Self Care | Payer: 59 | Source: Ambulatory Visit | Attending: Family | Admitting: Family

## 2016-11-01 DIAGNOSIS — R140 Abdominal distension (gaseous): Secondary | ICD-10-CM | POA: Diagnosis not present

## 2016-11-01 DIAGNOSIS — R1011 Right upper quadrant pain: Secondary | ICD-10-CM | POA: Diagnosis not present

## 2017-01-05 ENCOUNTER — Other Ambulatory Visit: Payer: Self-pay | Admitting: Internal Medicine

## 2017-01-05 ENCOUNTER — Encounter: Payer: Self-pay | Admitting: Internal Medicine

## 2017-01-05 ENCOUNTER — Ambulatory Visit (INDEPENDENT_AMBULATORY_CARE_PROVIDER_SITE_OTHER): Payer: 59

## 2017-01-05 ENCOUNTER — Ambulatory Visit (INDEPENDENT_AMBULATORY_CARE_PROVIDER_SITE_OTHER): Payer: 59 | Admitting: Internal Medicine

## 2017-01-05 DIAGNOSIS — R0789 Other chest pain: Secondary | ICD-10-CM

## 2017-01-05 DIAGNOSIS — R079 Chest pain, unspecified: Secondary | ICD-10-CM | POA: Diagnosis not present

## 2017-01-05 LAB — CBC WITH DIFFERENTIAL/PLATELET
Basophils Absolute: 0 10*3/uL (ref 0.0–0.1)
Basophils Relative: 0.4 % (ref 0.0–3.0)
EOS ABS: 0.1 10*3/uL (ref 0.0–0.7)
EOS PCT: 1.3 % (ref 0.0–5.0)
HCT: 35.9 % — ABNORMAL LOW (ref 36.0–46.0)
HEMOGLOBIN: 11.6 g/dL — AB (ref 12.0–15.0)
LYMPHS ABS: 2.2 10*3/uL (ref 0.7–4.0)
Lymphocytes Relative: 30 % (ref 12.0–46.0)
MCHC: 32.3 g/dL (ref 30.0–36.0)
MCV: 85.6 fl (ref 78.0–100.0)
Monocytes Absolute: 0.5 10*3/uL (ref 0.1–1.0)
Monocytes Relative: 6.2 % (ref 3.0–12.0)
NEUTROS ABS: 4.6 10*3/uL (ref 1.4–7.7)
NEUTROS PCT: 62.1 % (ref 43.0–77.0)
PLATELETS: 400 10*3/uL (ref 150.0–400.0)
RBC: 4.19 Mil/uL (ref 3.87–5.11)
RDW: 16.1 % — AB (ref 11.5–15.5)
WBC: 7.4 10*3/uL (ref 4.0–10.5)

## 2017-01-05 LAB — COMPREHENSIVE METABOLIC PANEL
ALBUMIN: 4.2 g/dL (ref 3.5–5.2)
ALT: 10 U/L (ref 0–35)
AST: 9 U/L (ref 0–37)
Alkaline Phosphatase: 59 U/L (ref 39–117)
BUN: 9 mg/dL (ref 6–23)
CO2: 28 mEq/L (ref 19–32)
CREATININE: 0.67 mg/dL (ref 0.40–1.20)
Calcium: 9.5 mg/dL (ref 8.4–10.5)
Chloride: 102 mEq/L (ref 96–112)
GFR: 109.81 mL/min (ref >60.00)
GLUCOSE: 157 mg/dL — AB (ref 70–99)
Potassium: 3.3 mEq/L — ABNORMAL LOW (ref 3.5–5.1)
SODIUM: 136 meq/L (ref 135–145)
Total Bilirubin: 0.4 mg/dL (ref 0.2–1.2)
Total Protein: 7.3 g/dL (ref 6.0–8.3)

## 2017-01-05 NOTE — Assessment & Plan Note (Addendum)
Symptoms present for one week associated with all activity,  Occasional heart racing ,  And recurrent stabbing pain under left breast.   No recent ravel,  Not on oral contraceptives.  But has calf assymetryn (R greater than left)  without tenderness on exam.  Will check D Dimer,  CBC and c hest x ray .  Has had Holter monitor in the recent past which was normal.  Discussed need to quit smoking,  consider resuming meds for anxiety if workup is normal.

## 2017-01-05 NOTE — Progress Notes (Signed)
Subjective:  Patient ID: Hannah Snow, female    DOB: 05-16-1985  Age: 31 y.o. MRN: [id number]  CC: The encounter diagnosis was Chest tightness.  HPI DESSIRAE SCAROLA presents for evaluation of chest tightness.    Outpatient Medications Prior to Visit  Medication Sig Dispense Refill  . PARAGARD INTRAUTERINE COPPER IUD IUD 1 each by Intrauterine route once.     No facility-administered medications prior to visit.     Review of Systems;  Patient denies headache, fevers, malaise, unintentional weight loss, skin rash, eye pain, sinus congestion and sinus pain, sore throat, dysphagia,  hemoptysis , cough, dyspnea, wheezing, chest pain, palpitations, orthopnea, edema, abdominal pain, nausea, melena, diarrhea, constipation, flank pain, dysuria, hematuria, urinary  Frequency, nocturia, numbness, tingling, seizures,  Focal weakness, Loss of consciousness,  Tremor, insomnia, depression, anxiety, and suicidal ideation.      Objective:  BP 124/72   Pulse 78   Resp 16   Wt 145 lb 9.6 oz (66 kg)   SpO2 99%   BMI 23.86 kg/m   BP Readings from Last 3 Encounters:  01/05/17 124/72  10/26/16 108/78  10/04/16 (!) 104/56    Wt Readings from Last 3 Encounters:  01/05/17 145 lb 9.6 oz (66 kg)  10/26/16 145 lb (65.8 kg)  10/04/16 144 lb 3.2 oz (65.4 kg)    General appearance: alert, cooperative and appears stated age Ears: normal TM's and external ear canals both ears Throat: lips, mucosa, and tongue normal; teeth and gums normal Neck: no adenopathy, no carotid bruit, supple, symmetrical, trachea midline and thyroid not enlarged, symmetric, no tenderness/mass/nodules Back: symmetric, no curvature. ROM normal. No CVA tenderness. Lungs: clear to auscultation bilaterally Heart: regular rate and rhythm, S1, S2 normal, no murmur, click, rub or gallop Abdomen: soft, non-tender; bowel sounds normal; no masses,  no organomegaly Pulses: 2+ and symmetric Skin: Skin color, texture, turgor  normal. No rashes or lesions Lymph nodes: Cervical, supraclavicular, and axillary nodes normal. Ext: right calf slightly larger than left,  Non tender.  Negative Homan's sign    No results found for: HGBA1C  Lab Results  Component Value Date   CREATININE 0.74 10/26/2016   CREATININE 0.53 07/02/2014   CREATININE 0.58 (L) 03/04/2012    Lab Results  Component Value Date   WBC 16.0 (H) 02/03/2015   HGB 11.6 (L) 02/03/2015   HCT 33.1 (L) 02/03/2015   PLT 206 02/03/2015   GLUCOSE 100 (H) 10/26/2016   ALT 11 10/26/2016   AST 12 10/26/2016   NA 135 10/26/2016   K 3.9 10/26/2016   CL 105 10/26/2016   CREATININE 0.74 10/26/2016   BUN 8 10/26/2016   CO2 27 10/26/2016    US Abdomen Complete  Result Date: 11/01/2016 CLINICAL DATA:  One month history of right upper quadrant abdominal pain associated with abdominal distention and bloating. EXAM: ABDOMEN ULTRASOUND COMPLETE COMPARISON:  None. FINDINGS: Gallbladder: No shadowing gallstones or echogenic sludge. No gallbladder wall thickening or pericholecystic fluid. Negative sonographic Murphy sign according to the ultrasound technologist. Common bile duct: Diameter: Approximately 3 mm. Liver: Normal size and echotexture without focal parenchymal abnormality. Patent portal vein with hepatopetal flow. IVC: Widely patent. Pancreas: While difficult to visualize in its entirety, visualized portions normal in appearance. Spleen: Normal size and echotexture without focal parenchymal abnormality. Right Kidney: Length: Approximate 11.2 cm. No hydronephrosis. Well-preserved cortex. No shadowing calculi. Normal parenchymal echotexture. No focal parenchymal abnormality. Left Kidney: Length: Approximate 11.5 cm. No hydronephrosis. Well-preserved cortex. No shadowing calculi.  Normal parenchymal echotexture. No focal parenchymal abnormality. Abdominal aorta: Normal in caliber throughout its visualized course in the abdomen without evidence of significant  atherosclerosis. Maximum diameter 2.0 cm. Other findings: None. IMPRESSION: Normal examination. Electronically Signed   By: Hulan Saas M.D.   On: 11/01/2016 08:43    Assessment & Plan:   Problem List Items Addressed This Visit    Chest tightness    Symptoms present for one week associated with all activity,  Occasional heart racing ,  And recurrent stabbing pain under left breast.   No recent ravel,  Not on oral contraceptives.  But has calf assymetryn (R greater than left)  without tenderness on exam.  Will check D Dimer,  CBC and c hest x ray .  Has had Holter monitor in the recent past which was normal.  Discussed need to quit smoking,  consider resuming meds for anxiety if workup is normal.       Relevant Orders   DG Chest 2 View   D-Dimer, Quantitative   CBC with Differential/Platelet   Comprehensive metabolic panel      I am having Ms. Pickel maintain her PARAGARD INTRAUTERINE COPPER.  No orders of the defined types were placed in this encounter.   There are no discontinued medications.  Follow-up: No Follow-up on file.   Sherlene Shams, MD

## 2017-01-06 LAB — D-DIMER, QUANTITATIVE: D-Dimer, Quant: 0.27 mcg/mL FEU (ref <0.50)

## 2017-01-06 MED ORDER — POTASSIUM CHLORIDE CRYS ER 20 MEQ PO TBCR: 20.0000 meq | EXTENDED_RELEASE_TABLET | Freq: Every day | ORAL | 0 refills | Status: DC

## 2017-01-06 MED ORDER — SERTRALINE HCL 25 MG PO TABS
25.0000 mg | ORAL_TABLET | Freq: Every day | ORAL | 0 refills | Status: DC
Start: 2017-01-06 — End: 2017-02-01

## 2017-01-06 NOTE — Progress Notes (Unsigned)
pota

## 2017-01-17 ENCOUNTER — Encounter: Payer: Self-pay | Admitting: Family

## 2017-01-17 ENCOUNTER — Ambulatory Visit (INDEPENDENT_AMBULATORY_CARE_PROVIDER_SITE_OTHER): Payer: 59 | Admitting: Family

## 2017-01-17 VITALS — BP 98/64 | HR 82 | Temp 98.5°F | Ht 65.5 in | Wt 145.6 lb

## 2017-01-17 DIAGNOSIS — R21 Rash and other nonspecific skin eruption: Secondary | ICD-10-CM | POA: Diagnosis not present

## 2017-01-17 LAB — POCT RAPID STREP A (OFFICE): Rapid Strep A Screen: NEGATIVE

## 2017-01-17 MED ORDER — VALACYCLOVIR HCL 1 G PO TABS: 1000.0000 mg | ORAL_TABLET | Freq: Three times a day (TID) | ORAL | 0 refills | Status: DC

## 2017-01-17 NOTE — Patient Instructions (Signed)
As discussed,symptom is a little nonspecific at this time however could be early shingles.   I have called in valtrex for you to start if rash, tingling feeling etc were to worsen.   Please let me know how you are doing!   Shingles Shingles, which is also known as herpes zoster, is an infection that causes a painful skin rash and fluid-filled blisters. Shingles is not related to genital herpes, which is a sexually transmitted infection. Shingles only develops in people who:  Have had chickenpox.  Have received the chickenpox vaccine. (This is rare.)  What are the causes? Shingles is caused by varicella-zoster virus (VZV). This is the same virus that causes chickenpox. After exposure to VZV, the virus stays in the body in an inactive (dormant) state. Shingles develops if the virus reactivates. This can happen many years after the initial exposure to VZV. It is not known what causes this virus to reactivate. What increases the risk? People who have had chickenpox or received the chickenpox vaccine are at risk for shingles. Infection is more common in people who:  Are older than age 1.  Have a weakened defense (immune) system, such as those with HIV, AIDS, or cancer.  Are taking medicines that weaken the immune system, such as transplant medicines.  Are under great stress.  What are the signs or symptoms? Early symptoms of this condition include itching, tingling, and pain in an area on your skin. Pain may be described as burning, stabbing, or throbbing. A few days or weeks after symptoms start, a painful red rash appears, usually on one side of the body in a bandlike or beltlike pattern. The rash eventually turns into fluid-filled blisters that break open, scab over, and dry up in about 2-3 weeks. At any time during the infection, you may also develop:  A fever.  Chills.  A headache.  An upset stomach.  How is this diagnosed? This condition is diagnosed with a skin exam.  Sometimes, skin or fluid samples are taken from the blisters before a diagnosis is made. These samples are examined under a microscope or sent to a lab for testing. How is this treated? There is no specific cure for this condition. Your health care provider will probably prescribe medicines to help you manage pain, recover more quickly, and avoid long-term problems. Medicines may include:  Antiviral drugs.  Anti-inflammatory drugs.  Pain medicines.  If the area involved is on your face, you may be referred to a specialist, such as an eye doctor (ophthalmologist) or an ear, nose, and throat (ENT) doctor to help you avoid eye problems, chronic pain, or disability. Follow these instructions at home: Medicines  Take medicines only as directed by your health care provider.  Apply an anti-itch or numbing cream to the affected area as directed by your health care provider. Blister and Rash Care  Take a cool bath or apply cool compresses to the area of the rash or blisters as directed by your health care provider. This may help with pain and itching.  Keep your rash covered with a loose bandage (dressing). Wear loose-fitting clothing to help ease the pain of material rubbing against the rash.  Keep your rash and blisters clean with mild soap and cool water or as directed by your health care provider.  Check your rash every day for signs of infection. These include redness, swelling, and pain that lasts or increases.  Do not pick your blisters.  Do not scratch your rash. General instructions  Rest as directed by your health care provider.  Keep all follow-up visits as directed by your health care provider. This is important.  Until your blisters scab over, your infection can cause chickenpox in people who have never had it or been vaccinated against it. To prevent this from happening, avoid contact with other people, especially: ? Babies. ? Pregnant women. ? Children who have  eczema. ? Elderly people who have transplants. ? People who have chronic illnesses, such as leukemia or AIDS. Contact a health care provider if:  Your pain is not relieved with prescribed medicines.  Your pain does not get better after the rash heals.  Your rash looks infected. Signs of infection include redness, swelling, and pain that lasts or increases. Get help right away if:  The rash is on your face or nose.  You have facial pain, pain around your eye area, or loss of feeling on one side of your face.  You have ear pain or you have ringing in your ear.  You have loss of taste.  Your condition gets worse. This information is not intended to replace advice given to you by your health care provider. Make sure you discuss any questions you have with your health care provider. Document Released: 03/21/2005 Document Revised: 11/15/2015 Document Reviewed: 01/30/2014 Elsevier Interactive Patient Education  2017 [employer].

## 2017-01-17 NOTE — Progress Notes (Signed)
Pre visit review using our clinic review tool, if applicable. No additional management support is needed unless otherwise documented below in the visit note. 

## 2017-01-17 NOTE — Progress Notes (Signed)
Subjective:    Patient ID: Hannah Snow, female    DOB: 11/08/1985, 31 y.o.   MRN: [id number]  CC: Hannah Snow is a 31 y.o. female who presents today for an acute visit.    HPI: Chief complaint of tingling pain in left posterior scalp 1 day, unchanged. yesterday started having ear pain left side which has improved. Endorses post nasal drip and sore throat. States she can feel a small "bump" under her hair. History of shingles in the ninth grade. Also describes warm,tingling sensation around bump under hair.   Otherwise feels well. No fever, chills, cough, ear discharge, trouble hearing.     HISTORY:  Past Medical History:  Diagnosis Date  . Anxiety   . Depression   . GERD (gastroesophageal reflux disease)    Past Surgical History:  Procedure Laterality Date  . CESAREAN SECTION    . CESAREAN SECTION N/A 02/02/2015   Procedure: CESAREAN SECTION;  Surgeon: Suzy Bouchard, MD;  Location: [facility] ORS;  Service: Obstetrics;  Laterality: N/A;   Family History  Problem Relation Age of Onset  . Hyperlipidemia Father   . Heart disease Father   . Hypertension Father   . Mental illness Father   . Diabetes Father   . Mental illness Sister   . Mental illness Brother   . Breast cancer Maternal Grandmother   . Lung cancer Maternal Grandmother   . Breast cancer Paternal Grandmother   . Mental illness Sister     Allergies: Latex and Nickel Current Outpatient Prescriptions on File Prior to Visit  Medication Sig Dispense Refill  . PARAGARD INTRAUTERINE COPPER IUD IUD 1 each by Intrauterine route once.    . potassium chloride SA (K-DUR,KLOR-CON) 20 MEQ tablet Take 1 tablet (20 mEq total) by mouth daily. 30 tablet 0  . sertraline (ZOLOFT) 25 MG tablet Take 1 tablet (25 mg total) by mouth daily. 90 tablet 0   No current facility-administered medications on file prior to visit.     Social History  Substance Use Topics  . Smoking status: Current Every Day Smoker   Last attempt to quit: 07/07/2014  . Smokeless tobacco: Never Used  . Alcohol use No    Review of Systems  Constitutional: Negative for chills and fever.  HENT: Positive for ear pain and sore throat. Negative for congestion, facial swelling, sinus pain and sinus pressure.   Respiratory: Negative for cough.   Cardiovascular: Negative for chest pain and palpitations.  Gastrointestinal: Negative for nausea and vomiting.  Skin: Positive for rash.      Objective:    BP 98/64   Pulse 82   Temp 98.5 F (36.9 C) (Oral)   Ht 5' 5.5" (1.664 m)   Wt 145 lb 9.6 oz (66 kg)   SpO2 99%   BMI 23.86 kg/m    Physical Exam  Constitutional: She appears well-developed and well-nourished.  HENT:  Head:    Nonfluctuant papule noted on scalp as marked on diagram. No vesicular lesion or surrounding erythema.   Eyes: Conjunctivae are normal.  Cardiovascular: Normal rate, regular rhythm, normal heart sounds and normal pulses.   Pulmonary/Chest: Effort normal and breath sounds normal. She has no wheezes. She has no rhonchi. She has no rales.  Neurological: She is alert.  Skin: Skin is warm and dry.  Psychiatric: She has a normal mood and affect. Her speech is normal and behavior is normal. Thought content normal.  Vitals reviewed.      Assessment & Plan:  1. Rash Afebrile and well appearing. May be early shingles. Will treat empirically.  Alternately a papule/ folliculitis. Return precautions given. Strep negative.  - valACYclovir (VALTREX) 1000 MG tablet; Take 1 tablet (1,000 mg total) by mouth 3 (three) times daily.  Dispense: 21 tablet; Refill: 0     I am having Ms. Belitz start on valACYclovir. I am also having her maintain her PARAGARD INTRAUTERINE COPPER, potassium chloride SA, and sertraline.   Meds ordered this encounter  Medications  . valACYclovir (VALTREX) 1000 MG tablet    Sig: Take 1 tablet (1,000 mg total) by mouth 3 (three) times daily.    Dispense:  21 tablet    Refill:   0    Order Specific Question:   Supervising Provider    Answer:   Sherlene Shams [[id number]]    Return precautions given.   Risks, benefits, and alternatives of the medications and treatment plan prescribed today were discussed, and patient expressed understanding.   Education regarding symptom management and diagnosis given to patient on AVS.  Continue to follow with Jerl Mina, MD for routine health maintenance.   Geni Bers and I agreed with plan.   Rennie Plowman, FNP

## 2017-02-01 ENCOUNTER — Other Ambulatory Visit: Payer: Self-pay | Admitting: Internal Medicine

## 2017-02-01 MED ORDER — SERTRALINE HCL 25 MG PO TABS: 25.0000 mg | ORAL_TABLET | Freq: Every day | ORAL | 1 refills | Status: DC

## 2017-02-02 ENCOUNTER — Other Ambulatory Visit: Payer: Self-pay | Admitting: Internal Medicine

## 2017-02-02 DIAGNOSIS — E876 Hypokalemia: Secondary | ICD-10-CM

## 2017-02-02 NOTE — Telephone Encounter (Signed)
Last refill was 3 weeks ago, K was 3.3, continue? Please advise, thanks

## 2017-02-02 NOTE — Telephone Encounter (Signed)
Do not refill potassium.  Let us recheck potassium level

## 2017-03-07 ENCOUNTER — Other Ambulatory Visit: Payer: Self-pay | Admitting: Internal Medicine

## 2017-03-07 MED ORDER — CIPROFLOXACIN HCL 250 MG PO TABS: 250.0000 mg | ORAL_TABLET | Freq: Two times a day (BID) | ORAL | 0 refills | Status: DC

## 2017-03-09 ENCOUNTER — Other Ambulatory Visit (INDEPENDENT_AMBULATORY_CARE_PROVIDER_SITE_OTHER): Payer: 59 | Admitting: Internal Medicine

## 2017-03-09 ENCOUNTER — Other Ambulatory Visit (INDEPENDENT_AMBULATORY_CARE_PROVIDER_SITE_OTHER): Payer: 59

## 2017-03-09 DIAGNOSIS — R102 Pelvic and perineal pain: Secondary | ICD-10-CM | POA: Diagnosis not present

## 2017-03-09 DIAGNOSIS — R002 Palpitations: Secondary | ICD-10-CM | POA: Diagnosis not present

## 2017-03-09 DIAGNOSIS — R42 Dizziness and giddiness: Secondary | ICD-10-CM | POA: Diagnosis not present

## 2017-03-09 DIAGNOSIS — R0789 Other chest pain: Secondary | ICD-10-CM | POA: Diagnosis not present

## 2017-03-09 DIAGNOSIS — F17200 Nicotine dependence, unspecified, uncomplicated: Secondary | ICD-10-CM | POA: Diagnosis not present

## 2017-03-09 LAB — POCT URINALYSIS DIPSTICK
BILIRUBIN UA: NEGATIVE
Glucose, UA: NEGATIVE
Ketones, UA: NEGATIVE
NITRITE UA: NEGATIVE
PH UA: 6 (ref 5.0–8.0)
PROTEIN UA: NEGATIVE
RBC UA: NEGATIVE
Spec Grav, UA: 1.025 (ref 1.010–1.025)
Urobilinogen, UA: 1 E.U./dL

## 2017-03-09 LAB — URINALYSIS, MICROSCOPIC ONLY: RBC / HPF: NONE SEEN (ref 0–?)

## 2017-03-10 LAB — URINE CULTURE
MICRO NUMBER: 81373577
RESULT: NO GROWTH
SPECIMEN QUALITY: ADEQUATE

## 2017-03-22 DIAGNOSIS — R002 Palpitations: Secondary | ICD-10-CM | POA: Diagnosis not present

## 2017-03-23 DIAGNOSIS — R002 Palpitations: Secondary | ICD-10-CM | POA: Diagnosis not present

## 2017-03-23 DIAGNOSIS — R0789 Other chest pain: Secondary | ICD-10-CM | POA: Diagnosis not present

## 2017-03-23 DIAGNOSIS — R0602 Shortness of breath: Secondary | ICD-10-CM | POA: Diagnosis not present

## 2017-03-30 DIAGNOSIS — R0602 Shortness of breath: Secondary | ICD-10-CM | POA: Diagnosis not present

## 2017-03-30 DIAGNOSIS — R002 Palpitations: Secondary | ICD-10-CM | POA: Diagnosis not present

## 2017-03-31 ENCOUNTER — Other Ambulatory Visit: Payer: Self-pay | Admitting: Internal Medicine

## 2017-03-31 MED ORDER — AMOXICILLIN-POT CLAVULANATE 875-125 MG PO TABS: 1.0000 | ORAL_TABLET | Freq: Two times a day (BID) | ORAL | 0 refills | Status: DC

## 2017-03-31 MED ORDER — PREDNISONE 10 MG PO TABS: ORAL_TABLET | ORAL | 0 refills | Status: DC

## 2017-05-19 ENCOUNTER — Other Ambulatory Visit: Payer: Self-pay | Admitting: Internal Medicine

## 2017-05-19 DIAGNOSIS — S0502XA Injury of conjunctiva and corneal abrasion without foreign body, left eye, initial encounter: Secondary | ICD-10-CM | POA: Insufficient documentation

## 2017-05-19 MED ORDER — ERYTHROMYCIN 5 MG/GM OP OINT: 1.0000 "application " | TOPICAL_OINTMENT | Freq: Four times a day (QID) | OPHTHALMIC | 0 refills | Status: DC

## 2017-05-19 NOTE — Progress Notes (Signed)
[facility]

## 2017-07-10 ENCOUNTER — Other Ambulatory Visit: Payer: Self-pay | Admitting: Internal Medicine

## 2017-07-10 ENCOUNTER — Other Ambulatory Visit (INDEPENDENT_AMBULATORY_CARE_PROVIDER_SITE_OTHER): Payer: 59

## 2017-07-10 DIAGNOSIS — R300 Dysuria: Secondary | ICD-10-CM | POA: Diagnosis not present

## 2017-07-10 LAB — URINALYSIS, ROUTINE W REFLEX MICROSCOPIC
Bilirubin Urine: NEGATIVE
Ketones, ur: NEGATIVE
Leukocytes, UA: NEGATIVE
Nitrite: NEGATIVE
PH: 7 (ref 5.0–8.0)
SPECIFIC GRAVITY, URINE: 1.02 (ref 1.000–1.030)
Total Protein, Urine: NEGATIVE
Urine Glucose: NEGATIVE
Urobilinogen, UA: 0.2 (ref 0.0–1.0)

## 2017-07-11 LAB — URINE CULTURE
MICRO NUMBER:: 90429509
Result:: NO GROWTH
SPECIMEN QUALITY: ADEQUATE

## 2017-08-24 ENCOUNTER — Other Ambulatory Visit: Payer: Self-pay | Admitting: Internal Medicine

## 2017-08-24 MED ORDER — SULFAMETHOXAZOLE-TRIMETHOPRIM 800-160 MG PO TABS: 1.0000 | ORAL_TABLET | Freq: Two times a day (BID) | ORAL | 0 refills | Status: DC

## 2017-08-24 NOTE — Progress Notes (Signed)
sptrad

## 2017-09-20 DIAGNOSIS — Z01419 Encounter for gynecological examination (general) (routine) without abnormal findings: Secondary | ICD-10-CM | POA: Diagnosis not present

## 2017-09-20 DIAGNOSIS — Z113 Encounter for screening for infections with a predominantly sexual mode of transmission: Secondary | ICD-10-CM | POA: Diagnosis not present

## 2017-09-20 DIAGNOSIS — N898 Other specified noninflammatory disorders of vagina: Secondary | ICD-10-CM | POA: Diagnosis not present

## 2017-09-20 DIAGNOSIS — Z124 Encounter for screening for malignant neoplasm of cervix: Secondary | ICD-10-CM | POA: Diagnosis not present

## 2017-10-09 ENCOUNTER — Other Ambulatory Visit: Payer: Self-pay | Admitting: Internal Medicine

## 2017-10-09 DIAGNOSIS — H6691 Otitis media, unspecified, right ear: Secondary | ICD-10-CM | POA: Insufficient documentation

## 2017-10-09 DIAGNOSIS — H6692 Otitis media, unspecified, left ear: Secondary | ICD-10-CM

## 2017-10-09 MED ORDER — AMOXICILLIN-POT CLAVULANATE 875-125 MG PO TABS: 1.0000 | ORAL_TABLET | Freq: Two times a day (BID) | ORAL | 0 refills | Status: DC

## 2017-10-09 MED ORDER — PSEUDOEPHEDRINE-CODEINE-GG 30-10-100 MG/5ML PO SOLN: 10.0000 mL | Freq: Four times a day (QID) | ORAL | 0 refills | Status: DC | PRN

## 2017-10-09 MED ORDER — PREDNISONE 10 MG PO TABS: ORAL_TABLET | ORAL | 0 refills | Status: DC

## 2017-10-09 NOTE — Assessment & Plan Note (Signed)
With congestion,  Purulent sputum production and severe left ear pain for the past 4 days .ear drum is retracted and erythematous

## 2017-10-20 DIAGNOSIS — R002 Palpitations: Secondary | ICD-10-CM | POA: Diagnosis not present

## 2017-10-20 DIAGNOSIS — R0789 Other chest pain: Secondary | ICD-10-CM | POA: Diagnosis not present

## 2018-01-11 DIAGNOSIS — H52223 Regular astigmatism, bilateral: Secondary | ICD-10-CM | POA: Diagnosis not present

## 2018-01-21 DIAGNOSIS — R399 Unspecified symptoms and signs involving the genitourinary system: Secondary | ICD-10-CM | POA: Diagnosis not present

## 2018-01-21 DIAGNOSIS — R1032 Left lower quadrant pain: Secondary | ICD-10-CM | POA: Diagnosis not present

## 2018-01-21 DIAGNOSIS — I889 Nonspecific lymphadenitis, unspecified: Secondary | ICD-10-CM | POA: Diagnosis not present

## 2018-02-07 DIAGNOSIS — K219 Gastro-esophageal reflux disease without esophagitis: Secondary | ICD-10-CM | POA: Diagnosis not present

## 2018-02-07 DIAGNOSIS — R5383 Other fatigue: Secondary | ICD-10-CM | POA: Diagnosis not present

## 2018-02-07 DIAGNOSIS — F411 Generalized anxiety disorder: Secondary | ICD-10-CM | POA: Diagnosis not present

## 2018-02-07 DIAGNOSIS — F329 Major depressive disorder, single episode, unspecified: Secondary | ICD-10-CM | POA: Diagnosis not present

## 2018-02-09 ENCOUNTER — Ambulatory Visit (INDEPENDENT_AMBULATORY_CARE_PROVIDER_SITE_OTHER): Payer: 59

## 2018-02-09 ENCOUNTER — Other Ambulatory Visit: Payer: Self-pay | Admitting: Internal Medicine

## 2018-02-09 ENCOUNTER — Telehealth: Payer: Self-pay | Admitting: Internal Medicine

## 2018-02-09 DIAGNOSIS — I471 Supraventricular tachycardia: Secondary | ICD-10-CM

## 2018-02-09 DIAGNOSIS — R9431 Abnormal electrocardiogram [ECG] [EKG]: Secondary | ICD-10-CM

## 2018-02-09 DIAGNOSIS — R002 Palpitations: Secondary | ICD-10-CM

## 2018-02-09 NOTE — Telephone Encounter (Signed)
Please let Nyjae know that her EKG was fine except that her PR interval was a little short.  This may be nothing,  But it could also predispose her to having episodes of rapid heart rate,  So I am ordering a Holter monitor for her to wear,  And getting a second opinion from [facility]

## 2018-02-09 NOTE — Telephone Encounter (Signed)
Pt is aware and I have given her a copy of her EKG

## 2018-03-20 NOTE — Progress Notes (Deleted)
New Outpatient Visit Date: 03/23/2018  Referring Provider: Sherlene Snow, Hannah L, MD [id number] [place], [place] [id number]  Chief Complaint: ***  HPI:  [name] is a 32 y.o. female who is being seen today for the evaluation of short PR interval and PSVT at the request of Dr. Darrick Huntsmanullo. She has a history of anxiety and atypical chest pain for which she was evaluated by Hannah IvanoffAnna Drane, PA, and [facility] July.  Preceding stress echocardiogram was normal.  She also wore a 24-hour Holter monitor in 2017, which reportedly showed only 1 PVC and otherwise sinus rhythm.  ***  --------------------------------------------------------------------------------------------------  Cardiovascular History & Procedures: Cardiovascular Problems:  Palpitations  Atypical chest pain  Risk Factors:  None  Cath/PCI:  None  CV Surgery:  None  EP Procedures and Devices:  24-hour Hol[facility] monitor ([facility] Single PVC.  Otherwise sinus rhythm.  Non-Invasive Evaluation(s):  Exercise stress echocardiogram (03/24/2017): Normal study.  Patient exercised 9:00 minutes.  Recent CV Pertinent Labs: Lab Results  Component Value Date   K 3.3 (L) 01/05/2017   K 3.6 07/02/2014   BUN 9 01/05/2017   BUN 10 07/02/2014   CREATININE 0.67 01/05/2017   CREATININE 0.53 07/02/2014    --------------------------------------------------------------------------------------------------  Past Medical History:  Diagnosis Date  . Anxiety   . Depression   . GERD (gastroesophageal reflux disease)     Past Surgical History:  Procedure Laterality Date  . CESAREAN SECTION    . CESAREAN SECTION N/A 02/02/2015   Procedure: CESAREAN SECTION;  Surgeon: Suzy Bouchardhomas J Schermerhorn, MD;  Location: [facility] ORS;  Service: Obstetrics;  Laterality: N/A;    No outpatient medications have been marked as taking for the 03/23/18 encounter (Appointment) with Keyontay Stolz, Cristal Deerhristopher, MD.    Allergies:  Latex and Nickel  Social History   Tobacco Use  . Smoking status: Current Every Day Smoker    Last attempt to quit: 07/07/2014    Years since quitting: 3.7  . Smokeless tobacco: Never Used  Substance Use Topics  . Alcohol use: No  . Drug use: No    Family History  Problem Relation Age of Onset  . Hyperlipidemia Father   . Heart disease Father   . Hypertension Father   . Mental illness Father   . Diabetes Father   . Mental illness Sister   . Mental illness Brother   . Breast cancer Maternal Grandmother   . Lung cancer Maternal Grandmother   . Breast cancer Paternal Grandmother   . Mental illness Sister     Review of Systems: A 12-system review of systems was performed and was negative except as noted in the HPI.  --------------------------------------------------------------------------------------------------  Physical Exam: There were no vitals taken for this visit.  General:  *** HEENT: No conjunctival pallor or scleral icterus. Moist mucous membranes. OP clear. Neck: Supple without lymphadenopathy, thyromegaly, JVD, or HJR. No carotid bruit. Lungs: Normal work of breathing. Clear to auscultation bilaterally without wheezes or crackles. Heart: Regular rate and rhythm without murmurs, rubs, or gallops. Non-displaced PMI. Abd: Bowel sounds present. Soft, NT/ND without hepatosplenomegaly Ext: No lower extremity edema. Radial, PT, and DP pulses are 2+ bilaterally Skin: Warm and dry without rash. Neuro: CNIII-XII intact. Strength and fine-touch sensation intact in upper and lower extremities bilaterally. Psych: Normal mood and affect.  EKG:  ***  Lab Results  Component Value Date   WBC 7.4 01/05/2017   HGB 11.6 (L) 01/05/2017   HCT 35.9 (L) 01/05/2017   MCV  85.6 01/05/2017   PLT 400.0 01/05/2017    Lab Results  Component Value Date   NA 136 01/05/2017   K 3.3 (L) 01/05/2017   CL 102 01/05/2017   CO2 28 01/05/2017   BUN 9 01/05/2017   CREATININE 0.67  01/05/2017   GLUCOSE 157 (H) 01/05/2017   ALT 10 01/05/2017    No results found for: CHOL, HDL, LDLCALC, LDLDIRECT, TRIG, CHOLHDL   --------------------------------------------------------------------------------------------------  ASSESSMENT AND PLAN: ***  Yvonne Kendall, MD 03/20/2018 8:56 PM

## 2018-03-22 DIAGNOSIS — F329 Major depressive disorder, single episode, unspecified: Secondary | ICD-10-CM | POA: Diagnosis not present

## 2018-03-22 DIAGNOSIS — F411 Generalized anxiety disorder: Secondary | ICD-10-CM | POA: Diagnosis not present

## 2018-03-23 ENCOUNTER — Ambulatory Visit: Payer: 59 | Admitting: Internal Medicine

## 2018-05-31 ENCOUNTER — Encounter: Payer: Self-pay | Admitting: Cardiovascular Disease

## 2018-05-31 ENCOUNTER — Ambulatory Visit (INDEPENDENT_AMBULATORY_CARE_PROVIDER_SITE_OTHER): Payer: No Typology Code available for payment source | Admitting: Cardiovascular Disease

## 2018-05-31 ENCOUNTER — Ambulatory Visit (INDEPENDENT_AMBULATORY_CARE_PROVIDER_SITE_OTHER): Payer: No Typology Code available for payment source

## 2018-05-31 VITALS — BP 100/60 | HR 75 | Ht 65.5 in | Wt 139.5 lb

## 2018-05-31 DIAGNOSIS — R002 Palpitations: Secondary | ICD-10-CM

## 2018-05-31 NOTE — Progress Notes (Signed)
Cardiology Office Note   Date:  05/31/2018   ID:  Hannah Snow, DOB [birth year], MRN [id number]  PCP:  Jerl Mina, MD  Cardiologist:   Lorine Bears, MD   Chief Complaint  Patient presents with  . other    Ref by Dr. Darrick Huntsman for PSVT. Meds reviewed by the pt. verbally. Pt. c/o chest discomfort, rapid heart beats and shortness of breath.       History of Present Illness: Hannah Snow is a 33 y.o. female who was referred by Dr. Darrick Huntsman for evaluation of palpitations and borderline abnormal EKG.  She has no significant chronic medical conditions other than tobacco use.  She smokes 3 cigarettes a day.  She works as a Clinical biochemist. She has family history of premature coronary artery disease.  Her father had cardiac events and stents as early as 32 years old. She was seen at [facility] cardiology in 2018 in 2019 for chest pain.  D-dimer was normal.  Stress echocardiogram in December 2018 was negative for ischemia.  She was able to exercise for 9 minutes with mild chest tightness.  Baseline echocardiogram showed normal LV systolic function with no significant valvular abnormalities.  24-hour Holter monitor showed sinus rhythm with no significant arrhythmia.  She reports intermittent episodes of palpitations described as fast tachycardia that lasted for 1 or 2 minutes and then resolves.  These episodes typically happen at rest.  Usually she has 2-3 episodes per week.  She has no associated dizziness, syncope or presyncope. She had an EKG done with Dr. Darrick Huntsman which showed short PR interval.  Past Medical History:  Diagnosis Date  . Anxiety   . Depression   . GERD (gastroesophageal reflux disease)     Past Surgical History:  Procedure Laterality Date  . CESAREAN SECTION    . CESAREAN SECTION N/A 02/02/2015   Procedure: CESAREAN SECTION;  Surgeon: Suzy Bouchard, MD;  Location: [facility] ORS;  Service: Obstetrics;  Laterality: N/A;     Current Outpatient Medications  Medication Sig  Dispense Refill  . PARAGARD INTRAUTERINE COPPER IUD IUD 1 each by Intrauterine route once.    . sertraline (ZOLOFT) 25 MG tablet Take 1 tablet (25 mg total) by mouth daily. 90 tablet 1   No current facility-administered medications for this visit.     Allergies:   Latex and Nickel    Social History:  The patient  reports that she has been smoking. She has never used smokeless tobacco. She reports that she does not drink alcohol or use drugs.   Family History:  The patient's family history includes Breast cancer in her maternal grandmother and paternal grandmother; Diabetes in her father; Heart disease in her father; Hyperlipidemia in her father and mother; Hypertension in her father; Lung cancer in her maternal grandmother; Mental illness in her brother, father, sister, and sister.    ROS:  Please see the history of present illness.   Otherwise, review of systems are positive for none.   All other systems are reviewed and negative.    PHYSICAL EXAM: VS:  BP 100/60 (BP Location: Right Arm, Patient Position: Sitting, Cuff Size: Normal)   Pulse 75   Ht 5' 5.5" (1.664 m)   Wt 139 lb 8 oz (63.3 kg)   BMI 22.86 kg/m  , BMI Body mass index is 22.86 kg/m. GEN: Well nourished, well developed, in no acute distress  HEENT: normal  Neck: no JVD, carotid bruits, or masses Cardiac: RRR; no murmurs, rubs, or gallops,no  edema  Respiratory:  clear to auscultation bilaterally, normal work of breathing GI: soft, nontender, nondistended, + BS MS: no deformity or atrophy  Skin: warm and dry, no rash Neuro:  Strength and sensation are intact Psych: euthymic mood, full affect   EKG:  EKG is ordered today. The ekg ordered today demonstrates normal sinus rhythm with no significant ST or T wave changes.   Recent Labs: No results found for requested labs within last 8760 hours.    Lipid Panel No results found for: CHOL, TRIG, HDL, CHOLHDL, VLDL, LDLCALC, LDLDIRECT    Wt Readings from Last 3  Encounters:  05/31/18 139 lb 8 oz (63.3 kg)  01/17/17 145 lb 9.6 oz (66 kg)  01/05/17 145 lb 9.6 oz (66 kg)      PAD Screen 05/31/2018  Previous PAD dx? No  Previous surgical procedure? No  Pain with walking? No  Feet/toe relief with dangling? No  Painful, non-healing ulcers? No  Extremities discolored? No      ASSESSMENT AND PLAN:  1.  Palpitations: She describes intermittent tachycardia suggestive of possible supraventricular tachycardia.  She had Holter monitors done in the past that showed no significant arrhythmia.  However, they were only for 24 hours and usually she has 2 or 3 episodes per week.  Thus, longer monitoring is warranted.  I requested a 2-week ZIO patch monitor. Her thyroid function was checked recently and it was normal.  She does not consume excessive amount of caffeine. Her EKG today is normal.  I did review her recent EKG that showed short PR interval.  However, there was no evidence of delta wave.  I do not think she has Wolff-Parkinson-White pattern.  2.  Previous atypical chest pain: She had work-up done with stress testing that was normal.  Her symptoms are overall atypical and she reports no recent symptoms.    Disposition:   FU with me as needed  Signed,  Lorine Bears, MD  05/31/2018 12:10 PM    [facility]

## 2018-05-31 NOTE — Patient Instructions (Signed)
Medication Instructions:  No changes If you need a refill on your cardiac medications before your next appointment, please call your pharmacy.   Lab work: None ordered  Testing/Procedures: Your physician has recommended that you wear a 2 week Zio event monitor. Event monitors are medical devices that record the heart's electrical activity. Doctors most often [place] these monitors to diagnose arrhythmias. Arrhythmias are problems with the speed or rhythm of the heartbeat. The monitor is a small, portable device. You can wear one while you do your normal daily activities. This is usually used to diagnose what is causing palpitations/syncope (passing out).  Follow-Up: As needed with Dr. Kirke Corin

## 2018-06-15 ENCOUNTER — Other Ambulatory Visit: Payer: Self-pay | Admitting: Internal Medicine

## 2018-06-15 MED ORDER — CEPHALEXIN 500 MG PO CAPS: 500.0000 mg | ORAL_CAPSULE | Freq: Three times a day (TID) | ORAL | 0 refills | Status: DC

## 2018-08-21 ENCOUNTER — Other Ambulatory Visit: Payer: Self-pay | Admitting: Internal Medicine

## 2018-08-21 DIAGNOSIS — R519 Headache, unspecified: Secondary | ICD-10-CM | POA: Insufficient documentation

## 2018-08-21 MED ORDER — FAMCICLOVIR 500 MG PO TABS: 500.0000 mg | ORAL_TABLET | Freq: Three times a day (TID) | ORAL | 0 refills | Status: DC

## 2018-08-21 NOTE — Assessment & Plan Note (Signed)
Shingles suspected. Affecting trigeminal nerve.  Famciclovir 500 mg tid prescribed

## 2019-02-05 ENCOUNTER — Telehealth: Payer: Self-pay | Admitting: *Deleted

## 2019-02-05 DIAGNOSIS — H539 Unspecified visual disturbance: Secondary | ICD-10-CM

## 2019-02-05 NOTE — Telephone Encounter (Signed)
On questioning, vision is returning to normal.  No headache.  No other neurological symptoms.  Question of flashes of light.  Was able to see eye doctor this am.  Order placed for referral.  Will let us know if any problems.

## 2019-02-05 NOTE — Telephone Encounter (Signed)
Patient at work this morning started to complain of  Described as looking through a Kaleidescope vision to right eye.  Took vitals, left  arm BP 110/94 pulse 84, 02 sats 99% on room air. Denied pain or any other symptom. Vision slowly became better initially was entire right eye reduce to peripheral outer right of right eye.  Had patient call Opthalmology and schedule appt. Notified MD in office. Needs referral to [facility] center of [place] [phone] Dr. Phineas Douglas.

## 2019-04-08 ENCOUNTER — Other Ambulatory Visit: Payer: Self-pay | Admitting: Internal Medicine

## 2019-04-08 MED ORDER — SULFAMETHOXAZOLE-TRIMETHOPRIM 800-160 MG PO TABS: 1.0000 | ORAL_TABLET | Freq: Two times a day (BID) | ORAL | 0 refills | Status: DC

## 2019-04-08 NOTE — Progress Notes (Unsigned)
Septra ds  

## 2019-04-09 ENCOUNTER — Other Ambulatory Visit: Payer: Self-pay | Admitting: Internal Medicine

## 2019-04-09 DIAGNOSIS — L089 Local infection of the skin and subcutaneous tissue, unspecified: Secondary | ICD-10-CM | POA: Insufficient documentation

## 2019-04-09 DIAGNOSIS — L723 Sebaceous cyst: Secondary | ICD-10-CM

## 2019-09-27 ENCOUNTER — Other Ambulatory Visit: Payer: Self-pay | Admitting: Internal Medicine

## 2019-09-27 MED ORDER — AMOXICILLIN-POT CLAVULANATE 875-125 MG PO TABS: 1.0000 | ORAL_TABLET | Freq: Two times a day (BID) | ORAL | 0 refills | Status: DC

## 2019-11-02 ENCOUNTER — Ambulatory Visit: Payer: No Typology Code available for payment source | Attending: Internal Medicine

## 2019-11-02 DIAGNOSIS — Z23 Encounter for immunization: Secondary | ICD-10-CM

## 2019-11-02 NOTE — Progress Notes (Signed)
   Covid-19 Vaccination Clinic  Name:  Hannah Snow    MRN: [id number] DOB: 09/05/1985  11/02/2019  [name] was observed post Covid-19 immunization for 15 minutes without incident. She was provided with Vaccine Information Sheet and instruction to access the V-Safe system.   [name] was instructed to call 911 with any severe reactions post vaccine: Marland Kitchen Difficulty breathing  . Swelling of face and throat  . A fast heartbeat  . A bad rash all over body  . Dizziness and weakness   Immunizations Administered    Name Date Dose VIS Date Route   Pfizer COVID-19 Vaccine 11/02/2019 11:10 AM 0.3 mL 05/29/2018 Intramuscular   Manufacturer: [employer], [employer]   Lot: [id number]   NDC: [address]-1

## 2019-11-25 ENCOUNTER — Ambulatory Visit: Payer: No Typology Code available for payment source | Attending: Internal Medicine

## 2019-11-25 ENCOUNTER — Other Ambulatory Visit: Payer: Self-pay | Admitting: Family Medicine

## 2019-11-25 DIAGNOSIS — Z23 Encounter for immunization: Secondary | ICD-10-CM

## 2019-11-25 NOTE — Progress Notes (Signed)
   Covid-19 Vaccination Clinic  Name:  Hannah Snow    MRN: [id number] DOB: 05/30/1985  11/25/2019  [name] was observed post Covid-19 immunization for 15 minutes without incident. She was provided with Vaccine Information Sheet and instruction to access the V-Safe system.   [name] was instructed to call 911 with any severe reactions post vaccine: Marland Kitchen Difficulty breathing  . Swelling of face and throat  . A fast heartbeat  . A bad rash all over body  . Dizziness and weakness   Immunizations Administered    Name Date Dose VIS Date Route   Pfizer COVID-19 Vaccine 11/25/2019  4:19 PM 0.3 mL 05/29/2018 Intramuscular   Manufacturer: [employer], [employer]   Lot: [id number]   NDC: [address]-2

## 2019-12-16 ENCOUNTER — Ambulatory Visit: Payer: No Typology Code available for payment source

## 2020-01-08 ENCOUNTER — Other Ambulatory Visit: Payer: Self-pay | Admitting: Family Medicine

## 2020-03-23 ENCOUNTER — Telehealth: Payer: Self-pay

## 2020-03-23 ENCOUNTER — Other Ambulatory Visit: Payer: Self-pay | Admitting: *Deleted

## 2020-03-23 ENCOUNTER — Other Ambulatory Visit: Payer: Self-pay

## 2020-03-23 ENCOUNTER — Other Ambulatory Visit: Payer: No Typology Code available for payment source

## 2020-03-23 DIAGNOSIS — Z20822 Contact with and (suspected) exposure to covid-19: Secondary | ICD-10-CM

## 2020-03-23 NOTE — Telephone Encounter (Signed)
Opened in error

## 2020-03-23 NOTE — Addendum Note (Signed)
Addended by: Warden Fillers on: 03/23/2020 04:30 PM   Modules accepted: Orders

## 2020-03-25 LAB — SARS-COV-2, NAA 2 DAY TAT

## 2020-03-25 LAB — NOVEL CORONAVIRUS, NAA: SARS-CoV-2, NAA: NOT DETECTED

## 2020-04-01 ENCOUNTER — Other Ambulatory Visit: Payer: Self-pay | Admitting: Family Medicine

## 2020-04-01 DIAGNOSIS — R1011 Right upper quadrant pain: Secondary | ICD-10-CM

## 2020-04-06 ENCOUNTER — Telehealth: Payer: Self-pay

## 2020-04-06 ENCOUNTER — Other Ambulatory Visit: Payer: Self-pay

## 2020-04-06 ENCOUNTER — Other Ambulatory Visit: Payer: Self-pay | Admitting: Internal Medicine

## 2020-04-06 ENCOUNTER — Other Ambulatory Visit (INDEPENDENT_AMBULATORY_CARE_PROVIDER_SITE_OTHER): Payer: No Typology Code available for payment source

## 2020-04-06 DIAGNOSIS — L509 Urticaria, unspecified: Secondary | ICD-10-CM

## 2020-04-06 LAB — CBC WITH DIFFERENTIAL/PLATELET
Basophils Absolute: 0 10*3/uL (ref 0.0–0.1)
Basophils Relative: 0.6 % (ref 0.0–3.0)
Eosinophils Absolute: 0.1 10*3/uL (ref 0.0–0.7)
Eosinophils Relative: 1.8 % (ref 0.0–5.0)
HCT: 36.5 % (ref 36.0–46.0)
Hemoglobin: 11.8 g/dL — ABNORMAL LOW (ref 12.0–15.0)
Lymphocytes Relative: 30.8 % (ref 12.0–46.0)
Lymphs Abs: 1.6 10*3/uL (ref 0.7–4.0)
MCHC: 32.4 g/dL (ref 30.0–36.0)
MCV: 85.1 fl (ref 78.0–100.0)
Monocytes Absolute: 0.4 10*3/uL (ref 0.1–1.0)
Monocytes Relative: 8.2 % (ref 3.0–12.0)
Neutro Abs: 3 10*3/uL (ref 1.4–7.7)
Neutrophils Relative %: 58.6 % (ref 43.0–77.0)
Platelets: 439 10*3/uL — ABNORMAL HIGH (ref 150.0–400.0)
RBC: 4.29 Mil/uL (ref 3.87–5.11)
RDW: 15 % (ref 11.5–15.5)
WBC: 5 10*3/uL (ref 4.0–10.5)

## 2020-04-06 LAB — SEDIMENTATION RATE: Sed Rate: 6 mm/hr (ref 0–20)

## 2020-04-06 MED ORDER — GOLYTELY 236 G PO SOLR
4000.0000 mL | Freq: Once | ORAL | 0 refills | Status: DC
Start: 2020-04-06 — End: 2020-04-06

## 2020-04-06 MED ORDER — GOLYTELY 236 G PO SOLR: 4000.0000 mL | Freq: Once | ORAL | 0 refills | Status: DC

## 2020-04-06 NOTE — Telephone Encounter (Signed)
Future orders placed for labs

## 2020-04-07 ENCOUNTER — Other Ambulatory Visit: Payer: Self-pay

## 2020-04-07 ENCOUNTER — Ambulatory Visit
Admission: RE | Admit: 2020-04-07 | Discharge: 2020-04-07 | Disposition: A | Discharge status: Home / Self Care | Payer: No Typology Code available for payment source | Source: Ambulatory Visit | Attending: Family Medicine | Admitting: Family Medicine

## 2020-04-07 ENCOUNTER — Other Ambulatory Visit: Payer: Self-pay | Admitting: Family Medicine

## 2020-04-07 DIAGNOSIS — R1011 Right upper quadrant pain: Secondary | ICD-10-CM | POA: Diagnosis not present

## 2020-04-09 ENCOUNTER — Other Ambulatory Visit: Payer: Self-pay | Admitting: Family Medicine

## 2020-04-09 DIAGNOSIS — R110 Nausea: Secondary | ICD-10-CM

## 2020-04-09 DIAGNOSIS — R1011 Right upper quadrant pain: Secondary | ICD-10-CM

## 2020-04-09 DIAGNOSIS — R109 Unspecified abdominal pain: Secondary | ICD-10-CM

## 2020-04-09 NOTE — Progress Notes (Signed)
There are no signs of infection or inflammation by your recent labs  (CBC ESR ) Regards,   Deborra Medina, MD

## 2020-04-10 ENCOUNTER — Other Ambulatory Visit: Payer: Self-pay | Admitting: Internal Medicine

## 2020-04-10 ENCOUNTER — Ambulatory Visit
Admission: RE | Admit: 2020-04-10 | Discharge: 2020-04-10 | Disposition: A | Discharge status: Home / Self Care | Payer: No Typology Code available for payment source | Attending: Internal Medicine | Admitting: Internal Medicine

## 2020-04-10 ENCOUNTER — Other Ambulatory Visit: Payer: Self-pay

## 2020-04-10 ENCOUNTER — Ambulatory Visit
Admission: RE | Admit: 2020-04-10 | Discharge: 2020-04-10 | Disposition: A | Discharge status: Home / Self Care | Payer: No Typology Code available for payment source | Source: Ambulatory Visit | Attending: Internal Medicine | Admitting: Internal Medicine

## 2020-04-10 DIAGNOSIS — R109 Unspecified abdominal pain: Secondary | ICD-10-CM | POA: Insufficient documentation

## 2020-04-13 ENCOUNTER — Encounter: Admission: RE | Admit: 2020-04-13 | Payer: No Typology Code available for payment source | Source: Ambulatory Visit

## 2020-04-15 ENCOUNTER — Other Ambulatory Visit: Payer: Self-pay | Admitting: Internal Medicine

## 2020-04-15 DIAGNOSIS — R1013 Epigastric pain: Secondary | ICD-10-CM

## 2020-04-15 NOTE — Telephone Encounter (Signed)
CMP added to labs

## 2020-04-15 NOTE — Addendum Note (Signed)
Addended by: Warden Fillers on: 04/15/2020 01:33 PM   Modules accepted: Orders

## 2020-04-16 ENCOUNTER — Other Ambulatory Visit: Payer: No Typology Code available for payment source

## 2020-04-16 DIAGNOSIS — R1013 Epigastric pain: Secondary | ICD-10-CM

## 2020-04-16 NOTE — Addendum Note (Signed)
Addended by: Warden Fillers on: 04/16/2020 01:05 PM   Modules accepted: Orders

## 2020-04-16 NOTE — Addendum Note (Signed)
Addended by: Warden Fillers on: 04/16/2020 01:07 PM   Modules accepted: Orders

## 2020-04-17 ENCOUNTER — Ambulatory Visit (HOSPITAL_COMMUNITY): Payer: No Typology Code available for payment source

## 2020-04-17 ENCOUNTER — Encounter
Admission: RE | Admit: 2020-04-17 | Discharge: 2020-04-17 | Disposition: A | Discharge status: Home / Self Care | Payer: No Typology Code available for payment source | Source: Ambulatory Visit | Attending: Family Medicine | Admitting: Family Medicine

## 2020-04-17 ENCOUNTER — Other Ambulatory Visit: Payer: No Typology Code available for payment source

## 2020-04-17 ENCOUNTER — Other Ambulatory Visit: Payer: Self-pay

## 2020-04-17 ENCOUNTER — Inpatient Hospital Stay (HOSPITAL_COMMUNITY): Admission: RE | Admit: 2020-04-17 | Payer: No Typology Code available for payment source | Source: Ambulatory Visit

## 2020-04-17 DIAGNOSIS — R109 Unspecified abdominal pain: Secondary | ICD-10-CM | POA: Insufficient documentation

## 2020-04-17 DIAGNOSIS — R1011 Right upper quadrant pain: Secondary | ICD-10-CM | POA: Diagnosis not present

## 2020-04-17 DIAGNOSIS — R110 Nausea: Secondary | ICD-10-CM | POA: Insufficient documentation

## 2020-04-17 MED ORDER — TECHNETIUM TC 99M MEBROFENIN IV KIT
5.0000 | PACK | Freq: Once | INTRAVENOUS | Status: AC | PRN
  Administered 2020-04-17: 4.95 via INTRAVENOUS

## 2020-04-18 LAB — H. PYLORI ANTIGEN, STOOL: H pylori Ag, Stl: NEGATIVE

## 2020-04-20 ENCOUNTER — Other Ambulatory Visit: Payer: Self-pay | Admitting: Internal Medicine

## 2020-04-20 DIAGNOSIS — K828 Other specified diseases of gallbladder: Secondary | ICD-10-CM | POA: Insufficient documentation

## 2020-04-20 NOTE — Assessment & Plan Note (Signed)
Confirmed with Positive HIDA scan.  Patient has been unable to tolerate any meal other than clear liquids without severe RUQ pain and will be referred to General Surgery for cholecystectomy

## 2020-04-21 ENCOUNTER — Telehealth: Payer: Self-pay | Admitting: Internal Medicine

## 2020-04-21 ENCOUNTER — Other Ambulatory Visit: Payer: Self-pay | Admitting: Internal Medicine

## 2020-04-21 DIAGNOSIS — R1011 Right upper quadrant pain: Secondary | ICD-10-CM

## 2020-04-21 DIAGNOSIS — K828 Other specified diseases of gallbladder: Secondary | ICD-10-CM

## 2020-04-21 NOTE — Telephone Encounter (Signed)
I left Hannah Snow at [facility] a vm to call ofc. I need to speak with her. thanks

## 2020-04-22 ENCOUNTER — Other Ambulatory Visit: Payer: Self-pay | Admitting: Internal Medicine

## 2020-04-22 MED ORDER — DICYCLOMINE HCL 10 MG PO CAPS: 10.0000 mg | ORAL_CAPSULE | Freq: Three times a day (TID) | ORAL | 0 refills | Status: DC

## 2020-04-22 NOTE — Telephone Encounter (Signed)
lft vm at [facility] [place] for Hannah Snow to return my call.

## 2020-04-23 ENCOUNTER — Ambulatory Visit: Payer: No Typology Code available for payment source

## 2020-04-24 ENCOUNTER — Other Ambulatory Visit: Payer: No Typology Code available for payment source

## 2020-04-27 ENCOUNTER — Encounter: Payer: Self-pay | Admitting: Surgery

## 2020-04-27 ENCOUNTER — Other Ambulatory Visit: Payer: Self-pay

## 2020-04-27 ENCOUNTER — Ambulatory Visit (INDEPENDENT_AMBULATORY_CARE_PROVIDER_SITE_OTHER): Payer: No Typology Code available for payment source | Admitting: Surgery

## 2020-04-27 VITALS — BP 121/82 | HR 74 | Temp 98.2°F | Ht 66.5 in | Wt 152.0 lb

## 2020-04-27 DIAGNOSIS — K828 Other specified diseases of gallbladder: Secondary | ICD-10-CM | POA: Diagnosis not present

## 2020-04-27 NOTE — H&P (View-Only) (Signed)
04/27/2020  Reason for Visit: Biliary dyskinesia  Referring Toshua Honsinger:  Duncan Dull, MD  History of Present Illness: Hannah Snow is a 35 y.o. female presenting for evaluation of biliary dyskinesia.  The patient reports a history of about 5 years of abdominal issues but over the past month she has had more issues with right upper quadrant pain after p.o. intake, associated with bloatedness and nausea.  The pain does not radiate.  Denies any constipation.  She has been able to tolerate some liquids and has noticed that if she eats very small frequent meals during the day, she is able to have enough p.o. intake for nutrition.  She denies any fevers, chills, chest pain, shortness of breath.  She saw her PCP for this late last month and initial ultrasound was done on 04/07/2020 which was negative for any cholelithiasis.  KUB and chest x-ray were also negative on 04/10/2020 and this was followed by a HIDA scan on 04/17/2020 which showed an ejection fraction of 14%.  Past Medical History: Past Medical History:  Diagnosis Date  . Anxiety   . Depression   . GERD (gastroesophageal reflux disease)      Past Surgical History: Past Surgical History:  Procedure Laterality Date  . CESAREAN SECTION    . CESAREAN SECTION N/A 02/02/2015   Procedure: CESAREAN SECTION;  Surgeon: Suzy Bouchard, MD;  Location: [facility] ORS;  Service: Obstetrics;  Laterality: N/A;    Home Medications: Prior to Admission medications   Medication Sig Start Date End Date Taking? Authorizing Jamil Castillo  loratadine (CLARITIN) 10 MG tablet Take 10 mg by mouth daily.   Yes Jaslyn Bansal, Historical, MD  omeprazole (PRILOSEC) 20 MG capsule Take 20 mg by mouth 2 (two) times daily. 04/16/20  Yes Estes Lehner, Historical, MD  PARAGARD INTRAUTERINE COPPER IUD IUD 1 each by Intrauterine route once.   Yes Nikira Kushnir, Historical, MD  sertraline (ZOLOFT) 25 MG tablet Take 1 tablet (25 mg total) by mouth daily. Patient taking differently: Take 25  mg by mouth daily. Takes 1.5 tablets daily for (37.5 mg) 02/01/17  Yes Sherlene Shams, MD  dicyclomine (BENTYL) 10 MG capsule Take 1 capsule (10 mg total) by mouth 4 (four) times daily -  before meals and at bedtime. Patient not taking: Reported on 04/27/2020 04/22/20   Sherlene Shams, MD    Allergies: Allergies  Allergen Reactions  . Latex Rash  . Nickel Rash    Social History:  reports that she has been smoking cigarettes. She has been smoking about 0.50 packs per day. She has never used smokeless tobacco. She reports current alcohol use. She reports that she does not use drugs.   Family History: Family History  Problem Relation Age of Onset  . Hyperlipidemia Father   . Hypertension Father   . Mental illness Father   . Diabetes Father   . Heart disease Father        stent   . Mental illness Sister   . Mental illness Brother   . Breast cancer Maternal Grandmother   . Lung cancer Maternal Grandmother   . Breast cancer Paternal Grandmother   . Mental illness Sister   . Hyperlipidemia Mother     Review of Systems: Review of Systems  Constitutional: Negative for chills and fever.  HENT: Negative for hearing loss.   Respiratory: Negative for shortness of breath.   Cardiovascular: Negative for chest pain.  Gastrointestinal: Positive for abdominal pain and nausea. Negative for constipation, diarrhea and vomiting.  Musculoskeletal:  Negative for myalgias.  Skin: Negative for rash.  Neurological: Negative for dizziness.  Psychiatric/Behavioral: Negative for depression.    Physical Exam BP 121/82   Pulse 74   Temp 98.2 F (36.8 C)   Ht 5' 6.5" (1.689 m)   Wt 152 lb (68.9 kg)   LMP 04/27/2020   SpO2 97%   BMI 24.17 kg/m  CONSTITUTIONAL: No acute distress HEENT:  Normocephalic, atraumatic, extraocular motion intact. NECK: Trachea is midline, and there is no jugular venous distension.  RESPIRATORY:  Lungs are clear, and breath sounds are equal bilaterally. Normal  respiratory effort without pathologic use of accessory muscles. CARDIOVASCULAR: Heart is regular without murmurs, gallops, or rubs. GI: The abdomen is soft, nondistended, with some tenderness in the right upper quadrant.  Negative Murphy's sign.  MUSCULOSKELETAL:  Normal muscle strength and tone in all four extremities.  No peripheral edema or cyanosis. SKIN: Skin turgor is normal. There are no pathologic skin lesions.  NEUROLOGIC:  Motor and sensation is grossly normal.  Cranial nerves are grossly intact. PSYCH:  Alert and oriented to person, place and time. Affect is normal.  Laboratory Analysis: Labs from 04/01/2020: Sodium 135, potassium 4.1, chloride 105, carbon dioxide 27.8, BUN 11, creatinine 0.7.  Total bilirubin 0.5, AST 16, ALT 13, alkaline phosphatase 63.  White blood cell count 5.3, hemoglobin 11.1, hematocrit 35.1, platelet 439.  Imaging: Ultrasound on 04/07/2020: IMPRESSION: Hyperechogenicity of the hepatic parenchyma. This is a nonspecific finding, which may be seen in the setting of hepatic steatosis or other chronic hepatic parenchymal disease.  Otherwise unremarkable right upper quadrant ultrasound, as Described.  HIDA scan on 04/17/2020: IMPRESSION: Reduced gallbladder ejection fraction as can be seen with chronic cholecystitis/biliary dyskinesia  Assessment and Plan: This is a 35 y.o. female with biliary dyskinesia.  -Discussed with patient that we will recommend surgical management for her biliary dyskinesia, as unfortunately there is no good medical management of this for medications that can improve the ejection fraction of her gallbladder.  Discussed with her the role for robotic cholecystectomy with ICG cholangiogram.  Reviewed with her the risks of bleeding, infection, injury to surrounding structures, postoperative recovery and activity restrictions, and she is willing to proceed.  She is aware that she would need COVID-19 testing prior to surgery.  Based on her  timing and schedule, we will schedule her for surgery on 05/12/2020.  Face-to-face time spent with the patient and care providers was 60 minutes, with more than 50% of the time spent counseling, educating, and coordinating care of the patient.     Howie Ill, MD [facility]

## 2020-04-27 NOTE — Progress Notes (Signed)
04/27/2020  Reason for Visit: Biliary dyskinesia  Referring Provider:  Teresa Tullo, MD  History of Present Illness: Hannah Snow is a 34 y.o. female presenting for evaluation of biliary dyskinesia.  The patient reports a history of about 5 years of abdominal issues but over the past month she has had more issues with right upper quadrant pain after p.o. intake, associated with bloatedness and nausea.  The pain does not radiate.  Denies any constipation.  She has been able to tolerate some liquids and has noticed that if she eats very small frequent meals during the day, she is able to have enough p.o. intake for nutrition.  She denies any fevers, chills, chest pain, shortness of breath.  She saw her PCP for this late last month and initial ultrasound was done on 04/07/2020 which was negative for any cholelithiasis.  KUB and chest x-ray were also negative on 04/10/2020 and this was followed by a HIDA scan on 04/17/2020 which showed an ejection fraction of 14%.  Past Medical History: Past Medical History:  Diagnosis Date  . Anxiety   . Depression   . GERD (gastroesophageal reflux disease)      Past Surgical History: Past Surgical History:  Procedure Laterality Date  . CESAREAN SECTION    . CESAREAN SECTION N/A 02/02/2015   Procedure: CESAREAN SECTION;  Surgeon: Thomas J Schermerhorn, MD;  Location: [facility] ORS;  Service: Obstetrics;  Laterality: N/A;    Home Medications: Prior to Admission medications   Medication Sig Start Date End Date Taking? Authorizing Provider  loratadine (CLARITIN) 10 MG tablet Take 10 mg by mouth daily.   Yes [provider]  omeprazole (PRILOSEC) 20 MG capsule Take 20 mg by mouth 2 (two) times daily. 04/16/20  Yes [provider]  PARAGARD INTRAUTERINE COPPER IUD IUD 1 each by Intrauterine route once.   Yes [provider]  sertraline (ZOLOFT) 25 MG tablet Take 1 tablet (25 mg total) by mouth daily. Patient taking differently: Take 25  mg by mouth daily. Takes 1.5 tablets daily for (37.5 mg) 02/01/17  Yes Tullo, Teresa L, MD  dicyclomine (BENTYL) 10 MG capsule Take 1 capsule (10 mg total) by mouth 4 (four) times daily -  before meals and at bedtime. Patient not taking: Reported on 04/27/2020 04/22/20   Tullo, Teresa L, MD    Allergies: Allergies  Allergen Reactions  . Latex Rash  . Nickel Rash    Social History:  reports that she has been smoking cigarettes. She has been smoking about 0.50 packs per day. She has never used smokeless tobacco. She reports current alcohol use. She reports that she does not use drugs.   Family History: Family History  Problem Relation Age of Onset  . Hyperlipidemia Father   . Hypertension Father   . Mental illness Father   . Diabetes Father   . Heart disease Father        stent   . Mental illness Sister   . Mental illness Brother   . Breast cancer Maternal Grandmother   . Lung cancer Maternal Grandmother   . Breast cancer Paternal Grandmother   . Mental illness Sister   . Hyperlipidemia Mother     Review of Systems: Review of Systems  Constitutional: Negative for chills and fever.  HENT: Negative for hearing loss.   Respiratory: Negative for shortness of breath.   Cardiovascular: Negative for chest pain.  Gastrointestinal: Positive for abdominal pain and nausea. Negative for constipation, diarrhea and vomiting.  Musculoskeletal:   Negative for myalgias.  Skin: Negative for rash.  Neurological: Negative for dizziness.  Psychiatric/Behavioral: Negative for depression.    Physical Exam BP 121/82   Pulse 74   Temp 98.2 F (36.8 C)   Ht 5' 6.5" (1.689 m)   Wt 152 lb (68.9 kg)   LMP 04/27/2020   SpO2 97%   BMI 24.17 kg/m  CONSTITUTIONAL: No acute distress HEENT:  Normocephalic, atraumatic, extraocular motion intact. NECK: Trachea is midline, and there is no jugular venous distension.  RESPIRATORY:  Lungs are clear, and breath sounds are equal bilaterally. Normal  respiratory effort without pathologic use of accessory muscles. CARDIOVASCULAR: Heart is regular without murmurs, gallops, or rubs. GI: The abdomen is soft, nondistended, with some tenderness in the right upper quadrant.  Negative Murphy's sign.  MUSCULOSKELETAL:  Normal muscle strength and tone in all four extremities.  No peripheral edema or cyanosis. SKIN: Skin turgor is normal. There are no pathologic skin lesions.  NEUROLOGIC:  Motor and sensation is grossly normal.  Cranial nerves are grossly intact. PSYCH:  Alert and oriented to person, place and time. Affect is normal.  Laboratory Analysis: Labs from 04/01/2020: Sodium 135, potassium 4.1, chloride 105, carbon dioxide 27.8, BUN 11, creatinine 0.7.  Total bilirubin 0.5, AST 16, ALT 13, alkaline phosphatase 63.  White blood cell count 5.3, hemoglobin 11.1, hematocrit 35.1, platelet 439.  Imaging: Ultrasound on 04/07/2020: IMPRESSION: Hyperechogenicity of the hepatic parenchyma. This is a nonspecific finding, which may be seen in the setting of hepatic steatosis or other chronic hepatic parenchymal disease.  Otherwise unremarkable right upper quadrant ultrasound, as Described.  HIDA scan on 04/17/2020: IMPRESSION: Reduced gallbladder ejection fraction as can be seen with chronic cholecystitis/biliary dyskinesia  Assessment and Plan: This is a 35 y.o. female with biliary dyskinesia.  -Discussed with patient that we will recommend surgical management for her biliary dyskinesia, as unfortunately there is no good medical management of this for medications that can improve the ejection fraction of her gallbladder.  Discussed with her the role for robotic cholecystectomy with ICG cholangiogram.  Reviewed with her the risks of bleeding, infection, injury to surrounding structures, postoperative recovery and activity restrictions, and she is willing to proceed.  She is aware that she would need COVID-19 testing prior to surgery.  Based on her  timing and schedule, we will schedule her for surgery on 05/12/2020.  Face-to-face time spent with the patient and care providers was 60 minutes, with more than 50% of the time spent counseling, educating, and coordinating care of the patient.     Howie Ill, MD [facility]

## 2020-04-27 NOTE — Patient Instructions (Addendum)
You have requested to have your gallbladder removed. This will be done on 05/12/20 at [facility] with Dr. Aleen Campi.  You will most likely be out of work 1-2 weeks for this surgery. You will return after your post-op appointment with a lifting restriction for approximately 4 more weeks.  You will be able to eat anything you would like to following surgery. But, start by eating a bland diet and advance this as tolerated. The Gallbladder diet is below, please go as closely by this diet as possible prior to surgery to avoid any further attacks.  Please see the (blue)pre-care form that you have been given today. Our surgery scheduler will call you to verify surgery date and to go over information. If you have any questions, please call our office.  Laparoscopic Cholecystectomy Laparoscopic cholecystectomy is surgery to remove the gallbladder. The gallbladder is located in the upper right part of the abdomen, behind the liver. It is a storage sac for bile, which is produced in the liver. Bile aids in the digestion and absorption of fats. Cholecystectomy is often done for inflammation of the gallbladder (cholecystitis). This condition is usually caused by a buildup of gallstones (cholelithiasis) in the gallbladder. Gallstones can block the flow of bile, and that can result in inflammation and pain. In severe cases, emergency surgery may be required. If emergency surgery is not required, you will have time to prepare for the procedure. Laparoscopic surgery is an alternative to open surgery. Laparoscopic surgery has a shorter recovery time. Your common bile duct may also need to be examined during the procedure. If stones are found in the common bile duct, they may be removed. LET [facility] CARE PROVIDER KNOW ABOUT:  Any allergies you have.  All medicines you are taking, including vitamins, herbs, eye drops, creams, and over-the-counter medicines.  Previous problems you or members of your family have  had with the use of anesthetics.  Any blood disorders you have.  Previous surgeries you have had.    Any medical conditions you have. RISKS AND COMPLICATIONS Generally, this is a safe procedure. However, problems may occur, including:  Infection.  Bleeding.  Allergic reactions to medicines.  Damage to other structures or organs.  A stone remaining in the common bile duct.  A bile leak from the cyst duct that is clipped when your gallbladder is removed.  The need to convert to open surgery, which requires a larger incision in the abdomen. This may be necessary if your surgeon thinks that it is not safe to continue with a laparoscopic procedure. BEFORE THE PROCEDURE  Ask your health care provider about:  Changing or stopping your regular medicines. This is especially important if you are taking diabetes medicines or blood thinners.  Taking medicines such as aspirin and ibuprofen. These medicines can thin your blood. Do not take these medicines before your procedure if your health care provider instructs you not to.  Follow instructions from your health care provider about eating or drinking restrictions.  Let your health care provider know if you develop a cold or an infection before surgery.  Plan to have someone take you home after the procedure.  Ask your health care provider how your surgical site will be marked or identified.  You may be given antibiotic medicine to help prevent infection. PROCEDURE  To reduce your risk of infection:  Your health care team will wash or sanitize their hands.  Your skin will be washed with soap.  An IV tube may be  inserted into one of your veins.  You will be given a medicine to make you fall asleep (general anesthetic).  A breathing tube will be placed in your mouth.  The surgeon will make several small cuts (incisions) in your abdomen.  A thin, lighted tube (laparoscope) that has a tiny camera on the end will be inserted  through one of the small incisions. The camera on the laparoscope will send a picture to a TV screen (monitor) in the operating room. This will give the surgeon a good view inside your abdomen.  A gas will be pumped into your abdomen. This will expand your abdomen to give the surgeon more room to perform the surgery.  Other tools that are needed for the procedure will be inserted through the other incisions. The gallbladder will be removed through one of the incisions.  After your gallbladder has been removed, the incisions will be closed with stitches (sutures), staples, or skin glue.  Your incisions may be covered with a bandage (dressing). The procedure may vary among health care providers and hospitals. AFTER THE PROCEDURE  Your blood pressure, heart rate, breathing rate, and blood oxygen level will be monitored often until the medicines you were given have worn off.  You will be given medicines as needed to control your pain.   This information is not intended to replace advice given to you by your health care provider. Make sure you discuss any questions you have with your health care provider.   Document Released: 03/21/2005 Document Revised: 12/10/2014 Document Reviewed: 10/31/2012 Elsevier Interactive Patient Education 2016 [place] Diet for Gallbladder Conditions A low-fat diet can be helpful if you have pancreatitis or a gallbladder condition. With these conditions, your pancreas and gallbladder have trouble digesting fats. A healthy eating plan with less fat will help rest your pancreas and gallbladder and reduce your symptoms. WHAT DO I NEED TO KNOW ABOUT THIS DIET?  Eat a low-fat diet.  Reduce your fat intake to less than 20-30% of your total daily calories. This is less than 50-60 g of fat per day.  Remember that you need some fat in your diet. Ask your dietician what your daily goal should be.  Choose nonfat and low-fat healthy foods. Look for the words  "nonfat," "low fat," or "fat free."  As a guide, look on the label and choose foods with less than 3 g of fat per serving. Eat only one serving.  Avoid alcohol.  Do not smoke. If you need help quitting, talk with your health care provider.  Eat small frequent meals instead of three large heavy meals. WHAT FOODS CAN I EAT? Grains Include healthy grains and starches such as potatoes, wheat bread, fiber-rich cereal, and brown rice. Choose whole grain options whenever possible. In adults, whole grains should account for 45-65% of your daily calories.  Fruits and Vegetables Eat plenty of fruits and vegetables. Fresh fruits and vegetables add fiber to your diet. Meats and Other Protein Sources Eat lean meat such as chicken and pork. Trim any fat off of meat before cooking it. Eggs, fish, and beans are other sources of protein. In adults, these foods should account for 10-35% of your daily calories. Dairy Choose low-fat milk and dairy options. Dairy includes fat and protein, as well as calcium.  Fats and Oils Limit high-fat foods such as fried foods, sweets, baked goods, sugary drinks.  Other Creamy sauces and condiments, such as mayonnaise, can add extra fat. Think about whether  or not you need to use them, or use smaller amounts or low fat options. WHAT FOODS ARE NOT RECOMMENDED?  High fat foods, such as:  [employer].  Ice cream.  [place] toast.  Sweet rolls.  Pizza.  Cheese bread.  Foods covered with batter, butter, creamy sauces, or cheese.  Fried foods.  Sugary drinks and desserts.  Foods that cause gas or bloating   This information is not intended to replace advice given to you by your health care provider. Make sure you discuss any questions you have with your health care provider.   Document Released: 03/26/2013 Document Reviewed: 03/26/2013 Elsevier Interactive Patient Education [employer].

## 2020-04-28 ENCOUNTER — Telehealth: Payer: Self-pay | Admitting: Surgery

## 2020-04-28 NOTE — Telephone Encounter (Signed)
Patient has been advised of Pre-Admission date/time, COVID Testing date and Surgery date.  Surgery Date: 05/12/20 Preadmission Testing Date: 04/30/20 (phone 1p-5p) Covid Testing Date: 05/08/20 - patient [facility] ([address] [facility]) between 8a-1p   Patient has been made aware to call [phone], between 1-3:00pm the day before surgery, to find out what time to arrive for surgery.

## 2020-04-30 ENCOUNTER — Encounter
Admission: RE | Admit: 2020-04-30 | Discharge: 2020-04-30 | Disposition: A | Discharge status: Home / Self Care | Payer: No Typology Code available for payment source | Source: Ambulatory Visit | Attending: Surgery | Admitting: Surgery

## 2020-04-30 ENCOUNTER — Other Ambulatory Visit: Payer: Self-pay

## 2020-04-30 HISTORY — DX: Personal history of urinary calculi: Z87.442

## 2020-04-30 HISTORY — DX: Anemia, unspecified: D64.9

## 2020-04-30 NOTE — Patient Instructions (Addendum)
Your procedure is scheduled on:05-26-20 TUESDAY Report to the Registration Desk on the 1st floor of the [facility]-Then proceed to the 2nd floor Surgery Desk in the Medical [facility] To find out your arrival time, please call [phone] between 1PM - 3PM on:05-25-20 MONDAY  REMEMBER: Instructions that are not followed completely may result in serious medical risk, up to and including death; or upon the discretion of your surgeon and anesthesiologist your surgery may need to be rescheduled.  Do not eat food after midnight the night before surgery.  No gum chewing, lozengers or hard candies.  You may however, drink CLEAR liquids up to 2 hours before you are scheduled to arrive for your surgery. Do not drink anything within 2 hours of your scheduled arrival time.  Clear liquids include: - water  - apple juice without pulp - gatorade - black coffee or tea (Do NOT add milk or creamers to the coffee or tea) Do NOT drink anything that is not on this list.  TAKE THESE MEDICATIONS THE MORNING OF SURGERY WITH A SIP OF WATER: -ZOLOFT (SERTRALINE) -PRILOSEC 40 MG (OMEPRAZOLE)-(take the night before and on the morning of surgery - helps to prevent nausea after surgery.)  One week prior to surgery: Stop Anti-inflammatories (NSAIDS) such as Advil, Aleve, Ibuprofen, Motrin, Naproxen, Naprosyn and Aspirin based products such as Excedrin, Goodys Powder, BC Powder-OK TO TAKE TYLENOL IF NEEDED  Stop ANY OVER THE COUNTER supplements until after surgery.  No Alcohol for 24 hours before or after surgery.  No Smoking including e-cigarettes for 24 hours prior to surgery.  No chewable tobacco products for at least 6 hours prior to surgery.  No nicotine patches on the day of surgery.  Do not use any "recreational" drugs for at least a week prior to your surgery.  Please be advised that the combination of cocaine and anesthesia may have negative outcomes, up to and including death. If you test positive for  cocaine, your surgery will be cancelled.  On the morning of surgery brush your teeth with toothpaste and water, you may rinse your mouth with mouthwash if you wish. Do not swallow any toothpaste or mouthwash.  Do not wear jewelry, make-up, hairpins, clips or nail polish.  Do not wear lotions, powders, or perfumes.   Do not shave body from the neck down 48 hours prior to surgery just in case you cut yourself which could leave a site for infection.  Also, freshly shaved skin may become irritated if using the CHG soap.  Contact lenses, hearing aids and dentures may not be worn into surgery.  Do not bring valuables to the hospital. [facility] is not responsible for any missing/lost belongings or valuables.   Use CHG Soap as directed on instruction sheet  Notify your doctor if there is any change in your medical condition (cold, fever, infection).  Wear comfortable clothing (specific to your surgery type) to the hospital.  Plan for stool softeners for home use; pain medications have a tendency to cause constipation. You can also help prevent constipation by eating foods high in fiber such as fruits and vegetables and drinking plenty of fluids as your diet allows.  After surgery, you can help prevent lung complications by doing breathing exercises.  Take deep breaths and cough every 1-2 hours. Your doctor may order a device called an Incentive Spirometer to help you take deep breaths. When coughing or sneezing, hold a pillow firmly against your incision with both hands. This is called "splinting." Doing  this helps protect your incision. It also decreases belly discomfort.  If you are being admitted to the hospital overnight, leave your suitcase in the car. After surgery it may be brought to your room.  If you are being discharged the day of surgery, you will not be allowed to drive home. You will need a responsible adult (18 years or older) to drive you home and stay with you that night.    If you are taking public transportation, you will need to have a responsible adult (18 years or older) with you. Please confirm with your physician that it is acceptable to use public transportation.   Please call the Pre-admissions Testing Dept. at [phone] if you have any questions about these instructions.  Visitation Policy:  Patients undergoing a surgery or procedure may have one family member or support person with them as long as that person is not COVID-19 positive or experiencing its symptoms.  That person may remain in the waiting area during the procedure.  Inpatient Visitation:    Visiting hours are 7 a.m. to 8 p.m. Patients will be allowed one visitor. The visitor may change daily. The visitor must pass COVID-19 screenings, use hand sanitizer when entering and exiting the patient's room and wear a mask at all times, including in the patient's room. Patients must also wear a mask when staff or their visitor are in the room. Masking is required regardless of vaccination status. Systemwide, no visitors 17 or younger.

## 2020-05-04 ENCOUNTER — Telehealth: Payer: Self-pay | Admitting: Surgery

## 2020-05-04 NOTE — Telephone Encounter (Signed)
Updated information regarding rescheduled surgery at Dr. Adelene Idler request, patient notified and ok with new date for surgery.  Patient has been advised of Pre-Admission date/time, COVID Testing date and Surgery date.  Surgery Date: 05/07/20 Preadmission Testing Date: 04/30/20 (phone 1p-5p) Covid Testing Date: 05/05/20 - patient [facility] ([address] [facility]) between 8a-1p  Patient has been made aware to call [phone], between 1-3:00pm the day before surgery, to find out what time to arrive for surgery.

## 2020-05-05 ENCOUNTER — Other Ambulatory Visit: Payer: Self-pay

## 2020-05-05 ENCOUNTER — Other Ambulatory Visit
Admission: RE | Admit: 2020-05-05 | Discharge: 2020-05-05 | Disposition: A | Discharge status: Home / Self Care | Payer: No Typology Code available for payment source | Source: Ambulatory Visit | Attending: Surgery | Admitting: Surgery

## 2020-05-05 DIAGNOSIS — Z20822 Contact with and (suspected) exposure to covid-19: Secondary | ICD-10-CM | POA: Insufficient documentation

## 2020-05-05 DIAGNOSIS — Z01812 Encounter for preprocedural laboratory examination: Secondary | ICD-10-CM | POA: Diagnosis not present

## 2020-05-05 LAB — SARS CORONAVIRUS 2 (TAT 6-24 HRS): SARS Coronavirus 2: NEGATIVE

## 2020-05-06 ENCOUNTER — Telehealth: Payer: Self-pay | Admitting: Surgery

## 2020-05-06 MED ORDER — ACETAMINOPHEN 500 MG PO TABS: 1000.0000 mg | ORAL_TABLET | ORAL | Status: AC

## 2020-05-06 MED ORDER — CHLORHEXIDINE GLUCONATE CLOTH 2 % EX PADS: 6.0000 | MEDICATED_PAD | Freq: Once | CUTANEOUS | Status: DC

## 2020-05-06 MED ORDER — LACTATED RINGERS IV SOLN: INTRAVENOUS | Status: DC

## 2020-05-06 MED ORDER — CHLORHEXIDINE GLUCONATE 0.12 % MT SOLN: 15.0000 mL | Freq: Once | OROMUCOSAL | Status: AC

## 2020-05-06 MED ORDER — GABAPENTIN 300 MG PO CAPS: 300.0000 mg | ORAL_CAPSULE | ORAL | Status: AC

## 2020-05-06 MED ORDER — ORAL CARE MOUTH RINSE
15.0000 mL | Freq: Once | OROMUCOSAL | Status: AC
  Administered 2020-05-26: 15 mL via OROMUCOSAL

## 2020-05-06 MED ORDER — INDOCYANINE GREEN 25 MG IV SOLR
2.5000 mg | INTRAVENOUS | Status: DC
  Filled 2020-05-06: qty 10

## 2020-05-06 MED ORDER — CEFAZOLIN SODIUM-DEXTROSE 2-4 GM/100ML-% IV SOLN: 2.0000 g | INTRAVENOUS | Status: AC

## 2020-05-06 NOTE — Telephone Encounter (Signed)
Patient calls concerned.  Her husband tested positive for Covid on 05/05/20.  Her scheduled test for Covid on 05/05/20 was negative.  She said since her husband tested positive that she did another test, home test and was still negative.   Spoke with Crystal in the OR, they will do a rapid prior to her surgery and if still negative will proceed with surgery. Patient is informed of this.

## 2020-05-07 ENCOUNTER — Other Ambulatory Visit: Payer: Self-pay

## 2020-05-07 ENCOUNTER — Other Ambulatory Visit
Admission: RE | Admit: 2020-05-07 | Discharge: 2020-05-07 | Disposition: A | Discharge status: Home / Self Care | Payer: No Typology Code available for payment source | Source: Ambulatory Visit | Attending: Surgery | Admitting: Surgery

## 2020-05-07 DIAGNOSIS — K828 Other specified diseases of gallbladder: Secondary | ICD-10-CM

## 2020-05-07 DIAGNOSIS — Z01812 Encounter for preprocedural laboratory examination: Secondary | ICD-10-CM | POA: Diagnosis present

## 2020-05-07 DIAGNOSIS — U071 COVID-19: Secondary | ICD-10-CM | POA: Insufficient documentation

## 2020-05-07 LAB — SARS CORONAVIRUS 2 BY RT PCR (HOSPITAL ORDER, PERFORMED IN ~~LOC~~ HOSPITAL LAB): SARS Coronavirus 2: POSITIVE — AB

## 2020-05-08 ENCOUNTER — Telehealth: Payer: Self-pay | Admitting: Surgery

## 2020-05-08 ENCOUNTER — Other Ambulatory Visit: Payer: No Typology Code available for payment source

## 2020-05-08 ENCOUNTER — Telehealth: Payer: Self-pay

## 2020-05-08 NOTE — Telephone Encounter (Signed)
Updated information regarding rescheduled surgery due to patient testing positive for Covid on 05/07/20.  Patient has been advised of Pre-Admission date/time, COVID Testing date and Surgery date.  Surgery Date: 05/26/20 Preadmission Testing Date: 04/30/20 (phone done) Covid Testing Date: 05/07/20, positive   Patient has been made aware to call [phone], between 1-3:00pm the day before surgery, to find out what time to arrive for surgery.

## 2020-05-08 NOTE — Telephone Encounter (Signed)
Called to discuss with patient about COVID-19 symptoms and the use of one of the available treatments for those with mild to moderate Covid symptoms and at a high risk of hospitalization.  Pt appears to qualify for outpatient treatment due to co-morbid conditions and/or a member of an at-risk group in accordance with the FDA Emergency Use Authorization.    Symptom onset: Not documented Vaccinated: Yes Booster? No Immunocompromised? No Qualifiers: None  Unable to reach pt - Voice mailbox is not set up.  Esther Hardy

## 2020-05-13 NOTE — Pre-Procedure Instructions (Signed)
Follow up call made to patient by this writer, her surgery is now scheduled for 05/26/20 after being cancelled due to positive Covid test. She has no new concerns about her surgery, no medication changes. Her interview has been done with no changes to her instructions. She is directed to call if any issues.

## 2020-05-20 ENCOUNTER — Telehealth: Payer: Self-pay

## 2020-05-20 DIAGNOSIS — R0609 Other forms of dyspnea: Secondary | ICD-10-CM

## 2020-05-20 DIAGNOSIS — R0789 Other chest pain: Secondary | ICD-10-CM

## 2020-05-20 DIAGNOSIS — U099 Post covid-19 condition, unspecified: Secondary | ICD-10-CM

## 2020-05-20 NOTE — Addendum Note (Signed)
Addended by: Sherlene Shams on: 05/20/2020 04:55 PM   Modules accepted: Orders

## 2020-05-20 NOTE — Telephone Encounter (Signed)
Patient stated she is having chest tightness X two days. Patient also stated she had a stabbing sharp px under left breast, while inhaling not on exhaling. Patient has slight SOB during the episodes. Coughing, nasal congestion, and PND Patient has not taken any medication for the sharp sx. Taking Childrens cold and cough syrup. Post covid. Patient is not experiencing lightheadedness, dizziness, heart pal[pitation, blurry vision, abnormal GERD sx, arm px, jaw px , and back pain.

## 2020-05-21 ENCOUNTER — Ambulatory Visit (INDEPENDENT_AMBULATORY_CARE_PROVIDER_SITE_OTHER): Payer: No Typology Code available for payment source

## 2020-05-21 DIAGNOSIS — U099 Post covid-19 condition, unspecified: Secondary | ICD-10-CM

## 2020-05-21 DIAGNOSIS — R0609 Other forms of dyspnea: Secondary | ICD-10-CM | POA: Diagnosis not present

## 2020-05-21 DIAGNOSIS — R0789 Other chest pain: Secondary | ICD-10-CM

## 2020-05-25 ENCOUNTER — Telehealth: Payer: Self-pay | Admitting: *Deleted

## 2020-05-25 NOTE — Telephone Encounter (Signed)
Faxed FMLA to [employer] at [phone] 

## 2020-05-26 ENCOUNTER — Ambulatory Visit
Admission: RE | Admit: 2020-05-26 | Discharge: 2020-05-26 | Disposition: A | Discharge status: Home / Self Care | Payer: No Typology Code available for payment source | Attending: Surgery | Admitting: Surgery

## 2020-05-26 ENCOUNTER — Ambulatory Visit: Payer: No Typology Code available for payment source | Admitting: Certified Registered"

## 2020-05-26 ENCOUNTER — Encounter: Payer: Self-pay | Admitting: Surgery

## 2020-05-26 ENCOUNTER — Encounter
Admission: RE | Disposition: A | Discharge status: Home / Self Care | Payer: Self-pay | Source: Home / Self Care | Attending: Surgery

## 2020-05-26 ENCOUNTER — Other Ambulatory Visit: Payer: Self-pay

## 2020-05-26 ENCOUNTER — Other Ambulatory Visit: Payer: Self-pay | Admitting: Surgery

## 2020-05-26 DIAGNOSIS — K811 Chronic cholecystitis: Secondary | ICD-10-CM | POA: Insufficient documentation

## 2020-05-26 DIAGNOSIS — Z91040 Latex allergy status: Secondary | ICD-10-CM | POA: Diagnosis not present

## 2020-05-26 DIAGNOSIS — Z91048 Other nonmedicinal substance allergy status: Secondary | ICD-10-CM | POA: Diagnosis not present

## 2020-05-26 DIAGNOSIS — F17210 Nicotine dependence, cigarettes, uncomplicated: Secondary | ICD-10-CM | POA: Insufficient documentation

## 2020-05-26 DIAGNOSIS — Z79899 Other long term (current) drug therapy: Secondary | ICD-10-CM | POA: Insufficient documentation

## 2020-05-26 DIAGNOSIS — K828 Other specified diseases of gallbladder: Secondary | ICD-10-CM | POA: Diagnosis present

## 2020-05-26 LAB — POCT PREGNANCY, URINE: Preg Test, Ur: NEGATIVE

## 2020-05-26 SURGERY — CHOLECYSTECTOMY, ROBOT-ASSISTED, LAPAROSCOPIC
Anesthesia: General

## 2020-05-26 MED ORDER — BUPIVACAINE-EPINEPHRINE (PF) 0.25% -1:200000 IJ SOLN
INTRAMUSCULAR | Status: AC
  Filled 2020-05-26: qty 30

## 2020-05-26 MED ORDER — SUGAMMADEX SODIUM 200 MG/2ML IV SOLN
INTRAVENOUS | Status: DC | PRN
  Administered 2020-05-26: 150 mg via INTRAVENOUS

## 2020-05-26 MED ORDER — OXYCODONE HCL 5 MG PO TABS: 5.0000 mg | ORAL_TABLET | ORAL | 0 refills | Status: DC | PRN

## 2020-05-26 MED ORDER — ROCURONIUM BROMIDE 100 MG/10ML IV SOLN
INTRAVENOUS | Status: DC | PRN
  Administered 2020-05-26: 50 mg via INTRAVENOUS
  Administered 2020-05-26: 20 mg via INTRAVENOUS

## 2020-05-26 MED ORDER — LACTATED RINGERS IV SOLN: INTRAVENOUS | Status: DC

## 2020-05-26 MED ORDER — ONDANSETRON HCL 4 MG/2ML IJ SOLN
INTRAMUSCULAR | Status: DC | PRN
  Administered 2020-05-26: 4 mg via INTRAVENOUS

## 2020-05-26 MED ORDER — FENTANYL CITRATE (PF) 100 MCG/2ML IJ SOLN
INTRAMUSCULAR | Status: AC
  Filled 2020-05-26: qty 2

## 2020-05-26 MED ORDER — ONDANSETRON HCL 4 MG/2ML IJ SOLN
4.0000 mg | Freq: Once | INTRAMUSCULAR | Status: AC | PRN
  Administered 2020-05-26: 4 mg via INTRAVENOUS

## 2020-05-26 MED ORDER — CHLORHEXIDINE GLUCONATE 0.12 % MT SOLN
OROMUCOSAL | Status: AC
  Filled 2020-05-26: qty 15

## 2020-05-26 MED ORDER — GABAPENTIN 300 MG PO CAPS: 300.0000 mg | ORAL_CAPSULE | Freq: Once | ORAL | Status: AC

## 2020-05-26 MED ORDER — INDOCYANINE GREEN 25 MG IV SOLR
2.5000 mg | Freq: Once | INTRAVENOUS | Status: AC
  Administered 2020-05-26: 2.5 mg via INTRAVENOUS
  Filled 2020-05-26: qty 1

## 2020-05-26 MED ORDER — KETOROLAC TROMETHAMINE 30 MG/ML IJ SOLN
INTRAMUSCULAR | Status: DC | PRN
  Administered 2020-05-26: 30 mg via INTRAVENOUS

## 2020-05-26 MED ORDER — DEXAMETHASONE SODIUM PHOSPHATE 10 MG/ML IJ SOLN
INTRAMUSCULAR | Status: AC
  Filled 2020-05-26: qty 1

## 2020-05-26 MED ORDER — IBUPROFEN 600 MG PO TABS: 600.0000 mg | ORAL_TABLET | Freq: Three times a day (TID) | ORAL | 1 refills | Status: DC | PRN

## 2020-05-26 MED ORDER — FENTANYL CITRATE (PF) 100 MCG/2ML IJ SOLN
INTRAMUSCULAR | Status: DC | PRN
  Administered 2020-05-26 (×2): 50 ug via INTRAVENOUS
  Administered 2020-05-26: 100 ug via INTRAVENOUS

## 2020-05-26 MED ORDER — FENTANYL CITRATE (PF) 100 MCG/2ML IJ SOLN
25.0000 ug | INTRAMUSCULAR | Status: DC | PRN
  Administered 2020-05-26 (×2): 25 ug via INTRAVENOUS

## 2020-05-26 MED ORDER — MIDAZOLAM HCL 2 MG/2ML IJ SOLN
INTRAMUSCULAR | Status: AC
  Filled 2020-05-26: qty 2

## 2020-05-26 MED ORDER — MIDAZOLAM HCL 2 MG/2ML IJ SOLN
INTRAMUSCULAR | Status: DC | PRN
  Administered 2020-05-26: 2 mg via INTRAVENOUS

## 2020-05-26 MED ORDER — PROPOFOL 10 MG/ML IV BOLUS
INTRAVENOUS | Status: DC | PRN
  Administered 2020-05-26: 150 mg via INTRAVENOUS

## 2020-05-26 MED ORDER — OXYCODONE HCL 5 MG PO TABS
5.0000 mg | ORAL_TABLET | Freq: Once | ORAL | Status: AC
  Administered 2020-05-26: 5 mg via ORAL

## 2020-05-26 MED ORDER — GABAPENTIN 300 MG PO CAPS
ORAL_CAPSULE | ORAL | Status: AC
  Administered 2020-05-26: 300 mg via ORAL
  Filled 2020-05-26: qty 1

## 2020-05-26 MED ORDER — CHLORHEXIDINE GLUCONATE 0.12 % MT SOLN: 15.0000 mL | Freq: Once | OROMUCOSAL | Status: DC

## 2020-05-26 MED ORDER — ONDANSETRON HCL 4 MG/2ML IJ SOLN
INTRAMUSCULAR | Status: AC
  Filled 2020-05-26: qty 2

## 2020-05-26 MED ORDER — OXYCODONE HCL 5 MG PO TABS
ORAL_TABLET | ORAL | Status: AC
  Filled 2020-05-26: qty 1

## 2020-05-26 MED ORDER — ROCURONIUM BROMIDE 10 MG/ML (PF) SYRINGE
PREFILLED_SYRINGE | INTRAVENOUS | Status: AC
  Filled 2020-05-26: qty 20

## 2020-05-26 MED ORDER — PROPOFOL 10 MG/ML IV BOLUS
INTRAVENOUS | Status: AC
  Filled 2020-05-26: qty 40

## 2020-05-26 MED ORDER — PHENYLEPHRINE HCL (PRESSORS) 10 MG/ML IV SOLN
INTRAVENOUS | Status: DC | PRN
  Administered 2020-05-26: 100 ug via INTRAVENOUS

## 2020-05-26 MED ORDER — CEFAZOLIN SODIUM-DEXTROSE 2-4 GM/100ML-% IV SOLN
INTRAVENOUS | Status: AC
  Filled 2020-05-26: qty 100

## 2020-05-26 MED ORDER — BUPIVACAINE-EPINEPHRINE (PF) 0.25% -1:200000 IJ SOLN
INTRAMUSCULAR | Status: DC | PRN
  Administered 2020-05-26: 30 mL

## 2020-05-26 MED ORDER — DEXAMETHASONE SODIUM PHOSPHATE 10 MG/ML IJ SOLN
INTRAMUSCULAR | Status: DC | PRN
  Administered 2020-05-26: 5 mg via INTRAVENOUS

## 2020-05-26 MED ORDER — FENTANYL CITRATE (PF) 100 MCG/2ML IJ SOLN
INTRAMUSCULAR | Status: AC
  Administered 2020-05-26: 25 ug via INTRAVENOUS
  Filled 2020-05-26: qty 2

## 2020-05-26 MED ORDER — LIDOCAINE HCL (CARDIAC) PF 100 MG/5ML IV SOSY
PREFILLED_SYRINGE | INTRAVENOUS | Status: DC | PRN
  Administered 2020-05-26: 80 mg via INTRAVENOUS

## 2020-05-26 MED ORDER — LIDOCAINE HCL (PF) 2 % IJ SOLN
INTRAMUSCULAR | Status: AC
  Filled 2020-05-26: qty 5

## 2020-05-26 MED ORDER — CEFAZOLIN SODIUM-DEXTROSE 2-4 GM/100ML-% IV SOLN
2.0000 g | Freq: Once | INTRAVENOUS | Status: AC
  Administered 2020-05-26: 2 g via INTRAVENOUS

## 2020-05-26 SURGICAL SUPPLY — 58 items
ADH SKN CLS APL DERMABOND .7 (GAUZE/BANDAGES/DRESSINGS) ×1
APL PRP STRL LF DISP 70% ISPRP (MISCELLANEOUS) ×1
BAG INFUSER PRESSURE 100CC (MISCELLANEOUS) IMPLANT
BAG SPEC RTRVL LRG 6X4 10 (ENDOMECHANICALS) ×1
CANISTER SUCT 1200ML W/VALVE (MISCELLANEOUS) IMPLANT
CANNULA REDUC XI 12-8 STAPL (CANNULA) ×1
CANNULA REDUCER 12-8 DVNC XI (CANNULA) ×1 IMPLANT
CHLORAPREP W/TINT 26 (MISCELLANEOUS) ×2 IMPLANT
CLIP VESOLOCK MED LG 6/CT (CLIP) ×2 IMPLANT
COVER TIP SHEARS 8 DVNC (MISCELLANEOUS) ×1 IMPLANT
COVER TIP SHEARS 8MM DA VINCI (MISCELLANEOUS) ×1
COVER WAND RF STERILE (DRAPES) ×2 IMPLANT
CUP MEDICINE 2OZ PLAST GRAD ST (MISCELLANEOUS) ×2 IMPLANT
DECANTER SPIKE VIAL GLASS SM (MISCELLANEOUS) ×2 IMPLANT
DEFOGGER SCOPE WARMER CLEARIFY (MISCELLANEOUS) ×2 IMPLANT
DERMABOND ADVANCED (GAUZE/BANDAGES/DRESSINGS) ×1
DERMABOND ADVANCED .7 DNX12 (GAUZE/BANDAGES/DRESSINGS) ×1 IMPLANT
DRAPE ARM DVNC X/XI (DISPOSABLE) ×4 IMPLANT
DRAPE COLUMN DVNC XI (DISPOSABLE) ×1 IMPLANT
DRAPE DA VINCI XI ARM (DISPOSABLE) ×4
DRAPE DA VINCI XI COLUMN (DISPOSABLE) ×1
ELECT CAUTERY BLADE TIP 2.5 (TIP) ×2
ELECT REM PT RETURN 9FT ADLT (ELECTROSURGICAL) ×2
ELECTRODE CAUTERY BLDE TIP 2.5 (TIP) ×1 IMPLANT
ELECTRODE REM PT RTRN 9FT ADLT (ELECTROSURGICAL) ×1 IMPLANT
GLOVE SURG SYN 7.0 (GLOVE) ×4 IMPLANT
GLOVE SURG SYN 7.5  E (GLOVE) ×2
GLOVE SURG SYN 7.5 E (GLOVE) ×2 IMPLANT
GOWN STRL REUS W/ TWL LRG LVL3 (GOWN DISPOSABLE) ×4 IMPLANT
GOWN STRL REUS W/TWL LRG LVL3 (GOWN DISPOSABLE) ×8
IRRIGATOR SUCT 8 DISP DVNC XI (IRRIGATION / IRRIGATOR) IMPLANT
IRRIGATOR SUCTION 8MM XI DISP (IRRIGATION / IRRIGATOR)
IV NS 1000ML (IV SOLUTION)
IV NS 1000ML BAXH (IV SOLUTION) IMPLANT
KIT PINK PAD W/HEAD ARE REST (MISCELLANEOUS) ×2
KIT PINK PAD W/HEAD ARM REST (MISCELLANEOUS) ×1 IMPLANT
LABEL OR SOLS (LABEL) ×2 IMPLANT
MANIFOLD NEPTUNE II (INSTRUMENTS) ×2 IMPLANT
NEEDLE HYPO 22GX1.5 SAFETY (NEEDLE) ×2 IMPLANT
NS IRRIG 500ML POUR BTL (IV SOLUTION) ×2 IMPLANT
OBTURATOR OPTICAL STANDARD 8MM (TROCAR) ×1
OBTURATOR OPTICAL STND 8 DVNC (TROCAR) ×1
OBTURATOR OPTICALSTD 8 DVNC (TROCAR) ×1 IMPLANT
PACK LAP CHOLECYSTECTOMY (MISCELLANEOUS) ×2 IMPLANT
PENCIL ELECTRO HAND CTR (MISCELLANEOUS) ×2 IMPLANT
POUCH SPECIMEN RETRIEVAL 10MM (ENDOMECHANICALS) ×2 IMPLANT
SEAL CANN UNIV 5-8 DVNC XI (MISCELLANEOUS) ×4 IMPLANT
SEAL XI 5MM-8MM UNIVERSAL (MISCELLANEOUS) ×4
SET TUBE SMOKE EVAC HIGH FLOW (TUBING) ×2 IMPLANT
SOLUTION ELECTROLUBE (MISCELLANEOUS) ×2 IMPLANT
SPONGE LAP 18X18 RF (DISPOSABLE) IMPLANT
SPONGE LAP 4X18 RFD (DISPOSABLE) ×2 IMPLANT
STAPLER CANNULA SEAL DVNC XI (STAPLE) ×1 IMPLANT
STAPLER CANNULA SEAL XI (STAPLE) ×1
SUT MNCRL AB 4-0 PS2 18 (SUTURE) ×2 IMPLANT
SUT VICRYL 0 AB UR-6 (SUTURE) ×4 IMPLANT
TAPE TRANSPORE STRL 2 31045 (GAUZE/BANDAGES/DRESSINGS) ×2 IMPLANT
TROCAR BALLN GELPORT 12X130M (ENDOMECHANICALS) ×2 IMPLANT

## 2020-05-26 NOTE — Interval H&P Note (Signed)
History and Physical Interval Note:  05/26/2020 7:06 AM  Hannah Snow  has presented today for surgery, with the diagnosis of biliary dyskinesia.  The various methods of treatment have been discussed with the patient and family. After consideration of risks, benefits and other options for treatment, the patient has consented to  Procedure(s): XI ROBOTIC ASSISTED LAPAROSCOPIC CHOLECYSTECTOMY (N/A) INDOCYANINE GREEN FLUORESCENCE IMAGING (ICG) (N/A) as a surgical intervention.  The patient's history has been reviewed, patient examined, no change in status, stable for surgery.  I have reviewed the patient's chart and labs.  Questions were answered to the patient's satisfaction.     Hannah Snow

## 2020-05-26 NOTE — Discharge Instructions (Signed)

## 2020-05-26 NOTE — Anesthesia Postprocedure Evaluation (Signed)
Anesthesia Post Note  Patient: Hannah Snow  Procedure(s) Performed: XI ROBOTIC ASSISTED LAPAROSCOPIC CHOLECYSTECTOMY (N/A ) INDOCYANINE GREEN FLUORESCENCE IMAGING (ICG) (N/A )  Patient location during evaluation: PACU Anesthesia Type: General Level of consciousness: awake and alert Pain management: pain level controlled Vital Signs Assessment: post-procedure vital signs reviewed and stable Respiratory status: spontaneous breathing and respiratory function stable Cardiovascular status: stable Anesthetic complications: no   No complications documented.   Last Vitals:  Vitals:   05/26/20 0942 05/26/20 1001  BP:  130/80  Pulse: 80 85  Resp: 15 18  Temp:  (!) 36.1 C  SpO2: 94% 100%    Last Pain:  Vitals:   05/26/20 1001  TempSrc: Temporal  PainSc: 5                  Bradyn Soward K

## 2020-05-26 NOTE — Op Note (Signed)
  Procedure Date:  05/26/2020  Pre-operative Diagnosis:  Biliary Dyskinesia  Post-operative Diagnosis:  Biliary Dyskinesia  Procedure:  Robotic assisted cholecystectomy with ICG FireFly cholangiogram  Surgeon:  Howie Ill, MD  Assistant:  Charlotta Newton, PA-S  Anesthesia:  General endotracheal  Estimated Blood Loss:  10 ml  Specimens:  gallbladder  Complications:  None  Indications for Procedure:  This is a 35 y.o. female who presents with abdominal pain and workup revealing biliary dyskinesia.  The benefits, complications, treatment options, and expected outcomes were discussed with the patient. The risks of bleeding, infection, recurrence of symptoms, failure to resolve symptoms, bile duct damage, bile duct leak, bowel injury, and need for further procedures were all discussed with the patient and she was willing to proceed.  Description of Procedure: The patient was correctly identified in the preoperative area and brought into the operating room.  The patient was placed supine with VTE prophylaxis in place.  Appropriate time-outs were performed.  Anesthesia was induced and the patient was intubated.  Appropriate antibiotics were infused.  The abdomen was prepped and draped in a sterile fashion. An infraumbilical incision was made. A cutdown technique was used to enter the abdominal cavity without injury, and a 12 mm robotic port was inserted.  Pneumoperitoneum was obtained with appropriate opening pressures.  Three 8-mm ports were placed in the mid abdomen at the level of the umbilicus under direct visualization.  The DaVinci platform was docked, camera targeted, and instruments were placed under direct visualization.  The gallbladder was identified.  The fundus was grasped and retracted cephalad.  Adhesions were lysed bluntly and with electrocautery. The infundibulum was grasped and retracted laterally, exposing the peritoneum overlying the gallbladder.  This was incised with  electrocautery and extended on either side of the gallbladder.  FireFly cholangiogram was then obtained, and we were able to clearly identify the cystic duct and common bile duct.  The cystic duct and cystic artery were carefully dissected with combination of cautery and blunt dissection.  Both were clipped twice proximally and once distally, cutting in between.  The gallbladder was taken from the gallbladder fossa in a retrograde fashion with electrocautery. The gallbladder was placed in an Endocatch bag. The liver bed was inspected and any bleeding was controlled with electrocautery. The right upper quadrant was then inspected again revealing intact clips, no bleeding, and no ductal injury.  The 8 mm ports were removed under direct visualization and the 12 mm port was removed.  The Endocatch bag was brought out via the umbilical incision. The fascial opening was closed using 0 vicryl suture.  Local anesthetic was infused in all incisions and the incisions were closed with 4-0 Monocryl.  The wounds were cleaned and sealed with DermaBond.  The patient was emerged from anesthesia and extubated and brought to the recovery room for further management.  The patient tolerated the procedure well and all counts were correct at the end of the case.   Howie Ill, MD

## 2020-05-26 NOTE — Anesthesia Preprocedure Evaluation (Signed)
Anesthesia Evaluation  Patient identified by MRN, date of birth, ID band Patient awake    Reviewed: Allergy & Precautions, NPO status , Patient's Chart, lab work & pertinent test results  History of Anesthesia Complications Negative for: history of anesthetic complications  Airway Mallampati: II       Dental   Pulmonary neg sleep apnea, neg COPD, Current Smoker and Patient abstained from smoking.,           Cardiovascular (-) hypertension(-) Past MI and (-) CHF (-) dysrhythmias (-) Valvular Problems/Murmurs     Neuro/Psych neg Seizures Anxiety Depression    GI/Hepatic Neg liver ROS, GERD  Medicated,  Endo/Other  neg diabetes  Renal/GU negative Renal ROS     Musculoskeletal   Abdominal   Peds  Hematology  (+) anemia ,   Anesthesia Other Findings   Reproductive/Obstetrics                             Anesthesia Physical Anesthesia Plan  ASA: II  Anesthesia Plan: General   Post-op Pain Management:    Induction: Intravenous  PONV Risk Score and Plan: 2 and Ondansetron and Dexamethasone  Airway Management Planned: Oral ETT  Additional Equipment:   Intra-op Plan:   Post-operative Plan:   Informed Consent: I have reviewed the patients History and Physical, chart, labs and discussed the procedure including the risks, benefits and alternatives for the proposed anesthesia with the patient or authorized representative who has indicated his/her understanding and acceptance.       Plan Discussed with:   Anesthesia Plan Comments:         Anesthesia Quick Evaluation

## 2020-05-26 NOTE — Anesthesia Procedure Notes (Signed)
Procedure Name: Intubation Date/Time: 05/26/2020 7:40 AM Performed by: Clyde Lundborg, CRNA Pre-anesthesia Checklist: Patient identified, Emergency Drugs available, Suction available and Patient being monitored Patient Re-evaluated:Patient Re-evaluated prior to induction Oxygen Delivery Method: Circle system utilized Preoxygenation: Pre-oxygenation with 100% oxygen Induction Type: IV induction Ventilation: Mask ventilation without difficulty Laryngoscope Size: McGraph and 3 Grade View: Grade I Tube type: Oral Tube size: 7.0 mm Number of attempts: 1 Airway Equipment and Method: Stylet and Oral airway Placement Confirmation: ETT inserted through vocal cords under direct vision,  positive ETCO2,  breath sounds checked- equal and bilateral and CO2 detector Secured at: 21 cm Tube secured with: Tape Dental Injury: Teeth and Oropharynx as per pre-operative assessment

## 2020-05-26 NOTE — Transfer of Care (Signed)
Immediate Anesthesia Transfer of Care Note  Patient: Hannah Snow  Procedure(s) Performed: XI ROBOTIC ASSISTED LAPAROSCOPIC CHOLECYSTECTOMY (N/A ) INDOCYANINE GREEN FLUORESCENCE IMAGING (ICG) (N/A )  Patient Location: PACU  Anesthesia Type:General  Level of Consciousness: awake, alert  and oriented  Airway & Oxygen Therapy: Patient Spontanous Breathing  Post-op Assessment: Report given to RN and Post -op Vital signs reviewed and stable  Post vital signs: Reviewed and stable  Last Vitals:  Vitals Value Taken Time  BP    Temp    Pulse    Resp    SpO2      Last Pain:  Vitals:   05/26/20 0611  TempSrc: Temporal  PainSc: 0-No pain         Complications: No complications documented.

## 2020-05-27 LAB — SURGICAL PATHOLOGY

## 2020-05-27 NOTE — Progress Notes (Signed)
05/27/20  Pathology results reviewed, showing mild chronic cholecystitis, without any evidence of malignancy.  Results released to patient via MyChart.  Henrene Dodge, MD

## 2020-06-11 ENCOUNTER — Other Ambulatory Visit: Payer: Self-pay

## 2020-06-11 ENCOUNTER — Encounter: Payer: Self-pay | Admitting: Physician Assistant

## 2020-06-11 ENCOUNTER — Ambulatory Visit (INDEPENDENT_AMBULATORY_CARE_PROVIDER_SITE_OTHER): Payer: No Typology Code available for payment source | Admitting: Physician Assistant

## 2020-06-11 VITALS — BP 122/82 | HR 74 | Temp 97.9°F | Ht 66.0 in | Wt 147.0 lb

## 2020-06-11 DIAGNOSIS — K828 Other specified diseases of gallbladder: Secondary | ICD-10-CM

## 2020-06-11 DIAGNOSIS — Z09 Encounter for follow-up examination after completed treatment for conditions other than malignant neoplasm: Secondary | ICD-10-CM

## 2020-06-11 NOTE — Patient Instructions (Addendum)
Try taking your Omperzole daily to help with discomfort and see if it assist with acid reflux after meal. Talk to your PCP about speaking with gastro. No more heavy lifting for 2 more weeks. Please contact us if you have any questions or concerns.  Gastroesophageal Reflux Disease, Adult  Gastroesophageal reflux (GER) happens when acid from the stomach flows up into the tube that connects the mouth and the stomach (esophagus). Normally, food travels down the esophagus and stays in the stomach to be digested. With GER, food and stomach acid sometimes move back up into the esophagus. You may have a disease called gastroesophageal reflux disease (GERD) if the reflux:  Happens often.  Causes frequent or very bad symptoms.  Causes problems such as damage to the esophagus. When this happens, the esophagus becomes sore and swollen. Over time, GERD can make small holes (ulcers) in the lining of the esophagus. What are the causes? This condition is caused by a problem with the muscle between the esophagus and the stomach. When this muscle is weak or not normal, it does not close properly to keep food and acid from coming back up from the stomach. The muscle can be weak because of:  Tobacco use.  Pregnancy.  Having a certain type of hernia (hiatal hernia).  Alcohol use.  Certain foods and drinks, such as coffee, chocolate, onions, and peppermint. What increases the risk?  Being overweight.  Having a disease that affects your connective tissue.  Taking NSAIDs, such a ibuprofen. What are the signs or symptoms?  Heartburn.  Difficult or painful swallowing.  The feeling of having a lump in the throat.  A bitter taste in the mouth.  Bad breath.  Having a lot of saliva.  Having an upset or bloated stomach.  Burping.  Chest pain. Different conditions can cause chest pain. Make sure you see your doctor if you have chest pain.  Shortness of breath or wheezing.  A long-term cough or  a cough at night.  Wearing away of the surface of teeth (tooth enamel).  Weight loss. How is this treated?  Making changes to your diet.  Taking medicine.  Having surgery. Treatment will depend on how bad your symptoms are. Follow these instructions at home: Eating and drinking  Follow a diet as told by your doctor. You may need to avoid foods and drinks such as: ? Coffee and tea, with or without caffeine. ? Drinks that contain alcohol. ? Energy drinks and sports drinks. ? Bubbly (carbonated) drinks or sodas. ? Chocolate and cocoa. ? Peppermint and mint flavorings. ? Garlic and onions. ? Horseradish. ? Spicy and acidic foods. These include peppers, chili powder, curry powder, vinegar, hot sauces, and BBQ sauce. ? Citrus fruit juices and citrus fruits, such as oranges, lemons, and limes. ? Tomato-based foods. These include red sauce, chili, salsa, and pizza with red sauce. ? Fried and fatty foods. These include donuts, french fries, potato chips, and high-fat dressings. ? High-fat meats. These include hot dogs, rib eye steak, sausage, ham, and bacon. ? High-fat dairy items, such as whole milk, butter, and cream cheese.  Eat small meals often. Avoid eating large meals.  Avoid drinking large amounts of liquid with your meals.  Avoid eating meals during the 2-3 hours before bedtime.  Avoid lying down right after you eat.  Do not exercise right after you eat.   Lifestyle  Do not smoke or use any products that contain nicotine or tobacco. If you need help quitting, ask  your doctor.  Try to lower your stress. If you need help doing this, ask your doctor.  If you are overweight, lose an amount of weight that is healthy for you. Ask your doctor about a safe weight loss goal.   General instructions  Pay attention to any changes in your symptoms.  Take over-the-counter and prescription medicines only as told by your doctor.  Do not take aspirin, ibuprofen, or other NSAIDs  unless your doctor says it is okay.  Wear loose clothes. Do not wear anything tight around your waist.  Raise (elevate) the head of your bed about 6 inches (15 cm). You may need to use a wedge to do this.  Avoid bending over if this makes your symptoms worse.  Keep all follow-up visits. Contact a doctor if:  You have new symptoms.  You lose weight and you do not know why.  You have trouble swallowing or it hurts to swallow.  You have wheezing or a cough that keeps happening.  You have a hoarse voice.  Your symptoms do not get better with treatment. Get help right away if:  You have sudden pain in your arms, neck, jaw, teeth, or back.  You suddenly feel sweaty, dizzy, or light-headed.  You have chest pain or shortness of breath.  You vomit and the vomit is green, yellow, or black, or it looks like blood or coffee grounds.  You faint.  Your poop (stool) is red, bloody, or black.  You cannot swallow, drink, or eat. These symptoms may represent a serious problem that is an emergency. Do not wait to see if the symptoms will go away. Get medical help right away. Call your local emergency services (911 in the [place] Do not drive yourself to the hospital. Summary  If a person has gastroesophageal reflux disease (GERD), food and stomach acid move back up into the esophagus and cause symptoms or problems such as damage to the esophagus.  Treatment will depend on how bad your symptoms are.  Follow a diet as told by your doctor.  Take all medicines only as told by your doctor. This information is not intended to replace advice given to you by your health care provider. Make sure you discuss any questions you have with your health care provider. Document Revised: 09/30/2019 Document Reviewed: 09/30/2019 Elsevier Patient Education  2021 [employer]  Food Choices for Gastroesophageal Reflux Disease, Adult When you have gastroesophageal reflux disease (GERD), the foods you eat  and your eating habits are very important. Choosing the right foods can help ease your discomfort. Think about working with a food expert (dietitian) to help you make good choices. What are tips for following this plan? Reading food labels  Look for foods that are low in saturated fat. Foods that may help with your symptoms include: ? Foods that have less than 5% of daily value (DV) of fat. ? Foods that have 0 grams of trans fat. Cooking  Do not fry your food.  Cook your food by baking, steaming, grilling, or broiling. These are all methods that do not need a lot of fat for cooking.  To add flavor, try to use herbs that are low in spice and acidity. Meal planning  Choose healthy foods that are low in fat, such as: ? Fruits and vegetables. ? Whole grains. ? Low-fat dairy products. ? Lean meats, fish, and poultry.  Eat small meals often instead of eating 3 large meals each day. Eat your meals slowly in a  place where you are relaxed. Avoid bending over or lying down until 2-3 hours after eating.  Limit high-fat foods such as fatty meats or fried foods.  Limit your intake of fatty foods, such as oils, butter, and shortening.  Avoid the following as told by your doctor: ? Foods that cause symptoms. These may be different for different people. Keep a food diary to keep track of foods that cause symptoms. ? Alcohol. ? Drinking a lot of liquid with meals. ? Eating meals during the 2-3 hours before bed.   Lifestyle  Stay at a healthy weight. Ask your doctor what weight is healthy for you. If you need to lose weight, work with your doctor to do so safely.  Exercise for at least 30 minutes on 5 or more days each week, or as told by your doctor.  Wear loose-fitting clothes.  Do not smoke or use any products that contain nicotine or tobacco. If you need help quitting, ask your doctor.  Sleep with the head of your bed higher than your feet. Use a wedge under the mattress or blocks under  the bed frame to raise the head of the bed.  Chew sugar-free gum after meals. What foods should eat? Eat a healthy, well-balanced diet of fruits, vegetables, whole grains, low-fat dairy products, lean meats, fish, and poultry. Each person is different. Foods that may cause symptoms in one person may not cause any symptoms in another person. Work with your doctor to find foods that are safe for you. The items listed above may not be a complete list of what you can eat and drink. Contact a food expert for more options.   What foods should I avoid? Limiting some of these foods may help in managing the symptoms of GERD. Everyone is different. Talk with a food expert or your doctor to help you find the exact foods to avoid, if any. Fruits Any fruits prepared with added fat. Any fruits that cause symptoms. For some people, this may include citrus fruits, such as oranges, grapefruit, pineapple, and lemons. Vegetables Deep-fried vegetables. [place] fries. Any vegetables prepared with added fat. Any vegetables that cause symptoms. For some people, this may include tomatoes and tomato products, chili peppers, onions and garlic, and horseradish. Grains Pastries or quick breads with added fat. Meats and other proteins High-fat meats, such as fatty beef or pork, hot dogs, ribs, ham, sausage, salami, and bacon. Fried meat or protein, including fried fish and fried chicken. Nuts and nut butters, in large amounts. Dairy Whole milk and chocolate milk. Sour cream. Cream. Ice cream. Cream cheese. Milkshakes. Fats and oils Butter. Margarine. Shortening. Ghee. Beverages Coffee and tea, with or without caffeine. Carbonated beverages. Sodas. Energy drinks. Fruit juice made with acidic fruits, such as orange or grapefruit. Tomato juice. Alcoholic drinks. Sweets and desserts Chocolate and cocoa. Donuts. Seasonings and condiments Pepper. Peppermint and spearmint. Added salt. Any condiments, herbs, or seasonings that  cause symptoms. For some people, this may include curry, hot sauce, or vinegar-based salad dressings. The items listed above may not be a complete list of what you should not eat and drink. Contact a food expert for more options. Questions to ask your doctor Diet and lifestyle changes are often the first steps that are taken to manage symptoms of GERD. If diet and lifestyle changes do not help, talk with your doctor about taking medicines. Where to find more information  [employer] G[employer]intestinal [employer]: aboutgerd.org Summary  When you have GERD, food  and lifestyle choices are very important in easing your symptoms.  Eat small meals often instead of 3 large meals a day. Eat your meals slowly and in a place where you are relaxed.  Avoid bending over or lying down until 2-3 hours after eating.  Limit high-fat foods such as fatty meats or fried foods. This information is not intended to replace advice given to you by your health care provider. Make sure you discuss any questions you have with your health care provider. Document Revised: 09/30/2019 Document Reviewed: 09/30/2019 Elsevier Patient Education  2021 [employer]

## 2020-06-11 NOTE — Progress Notes (Signed)
[facility] POST-OP OFFICE VISIT  06/11/2020  HPI: Hannah Snow is a 35 y.o. female 19 days s/p laparoscopic cholecystectomy for biliary dyskinesia with Dr Aleen Campi.   She reports that she continues to have indigestion symptoms sporadically often with eating. She reports this is a burning sensation which initiates in her epigastrium and can radiate up to her chest, neck, and jaw. She denied any nausea, or emesis. She takes Mylanta which can provide some relief. She also have Omeprazole but does not take this regularly.  Otherwise, she hs no incisional pain or soreness No fever, chills Having bowel function, tolerating PO No other complaints   Vital signs: BP 122/82   Pulse 74   Temp 97.9 F (36.6 C) (Oral)   Ht 5\' 6"  (1.676 m)   Wt 147 lb (66.7 kg)   SpO2 98%   BMI 23.73 kg/m    Physical Exam: Constitutional: Well appearing female, NAD Abdomen: Soft, non-tender, non-distended, no rebound/guarding Skin: Laparoscopic incisions are healing well, no erythema or drainage   Assessment/Plan: This is a 35 y.o. female 19 days s/p laparoscopic cholecystectomy for biliary dyskinesia   - Her residual symptoms do sound very consistent with GERD. I encouraged her to take Omeprazole daily with TUMS/Mylanta prn. Recommend discussion of these symptoms with her PCP as she may benefit from GI evaluation +/- EGD.    - Pain control prn; Tylenol/Motrin  - Reviewed wound care  - Reviewed lifting restrictions; 4 weeks total   - Reviewed pathology: Chronic cholecystitis   - She can follow up in surgery clinic as needed   -- 33, PA-C [facility] 06/11/2020, 11:36 AM [id number] M-F: 7am - 4pm

## 2020-06-22 ENCOUNTER — Other Ambulatory Visit: Payer: Self-pay

## 2020-06-22 MED ORDER — OMEPRAZOLE 20 MG PO CPDR: 40.0000 mg | DELAYED_RELEASE_CAPSULE | ORAL | 3 refills | Status: DC | PRN

## 2020-07-02 ENCOUNTER — Encounter: Payer: Self-pay | Admitting: Internal Medicine

## 2020-07-02 ENCOUNTER — Other Ambulatory Visit: Payer: Self-pay | Admitting: Internal Medicine

## 2020-07-02 ENCOUNTER — Other Ambulatory Visit: Payer: Self-pay

## 2020-07-02 ENCOUNTER — Ambulatory Visit (INDEPENDENT_AMBULATORY_CARE_PROVIDER_SITE_OTHER): Payer: No Typology Code available for payment source | Admitting: Internal Medicine

## 2020-07-02 VITALS — BP 116/76 | HR 83 | Ht 66.0 in | Wt 148.0 lb

## 2020-07-02 DIAGNOSIS — R110 Nausea: Secondary | ICD-10-CM

## 2020-07-02 DIAGNOSIS — R21 Rash and other nonspecific skin eruption: Secondary | ICD-10-CM | POA: Diagnosis not present

## 2020-07-02 DIAGNOSIS — L509 Urticaria, unspecified: Secondary | ICD-10-CM

## 2020-07-02 DIAGNOSIS — T148XXA Other injury of unspecified body region, initial encounter: Secondary | ICD-10-CM

## 2020-07-02 MED ORDER — DOXYCYCLINE HYCLATE 100 MG PO TABS: 100.0000 mg | ORAL_TABLET | Freq: Two times a day (BID) | ORAL | 0 refills | Status: DC

## 2020-07-02 MED ORDER — METHYLPREDNISOLONE ACETATE 40 MG/ML IJ SUSP
40.0000 mg | Freq: Once | INTRAMUSCULAR | Status: AC
  Administered 2020-07-02: 40 mg via INTRAMUSCULAR

## 2020-07-02 MED ORDER — HYDROXYZINE PAMOATE 25 MG PO CAPS: 25.0000 mg | ORAL_CAPSULE | Freq: Three times a day (TID) | ORAL | 0 refills | Status: DC | PRN

## 2020-07-02 MED ORDER — TRIAMCINOLONE ACETONIDE 0.1 % EX CREA: 1.0000 "application " | TOPICAL_CREAM | Freq: Two times a day (BID) | CUTANEOUS | 1 refills | Status: DC | PRN

## 2020-07-02 MED ORDER — MUPIROCIN 2 % EX OINT: 1.0000 "application " | TOPICAL_OINTMENT | Freq: Three times a day (TID) | CUTANEOUS | 0 refills | Status: DC

## 2020-07-02 MED ORDER — ONDANSETRON HCL 4 MG PO TABS: 4.0000 mg | ORAL_TABLET | Freq: Three times a day (TID) | ORAL | 0 refills | Status: DC | PRN

## 2020-07-02 NOTE — Progress Notes (Signed)
Chief Complaint  Patient presents with  . Rash   Acute visit  1. Hives itchy red spots changing places since 1 week ago on Thursday to arms/legs, trunk, face no one else has rash in house she is allergic to latex and recently used a latex bandaid (and is allergic to latex) to healing wounds s/p GB removal 05/26/20. 4-5 incision sites red and 1 was draining pus to the right right abdomen tried otc hydrocortisone w/o relief. No new skin products uses dove sensitive skin soap. Given depomedrol 40 mg x 1 earlier in the day and calmed down the severe itching but she is still severely itching    Review of Systems  Constitutional: Negative for weight loss.  HENT: Negative for hearing loss.   Eyes: Negative for blurred vision.  Respiratory: Negative for shortness of breath.   Cardiovascular: Negative for chest pain.  Gastrointestinal: Negative for abdominal pain.  Skin: Positive for itching and rash.   Past Medical History:  Diagnosis Date  . Anemia   . Anxiety   . Depression   . GERD (gastroesophageal reflux disease)   . History of kidney stones    H/O   Past Surgical History:  Procedure Laterality Date  . CESAREAN SECTION    . CESAREAN SECTION N/A 02/02/2015   Procedure: CESAREAN SECTION;  Surgeon: Suzy Bouchard, MD;  Location: [facility] ORS;  Service: Obstetrics;  Laterality: N/A;   Family History  Problem Relation Age of Onset  . Hyperlipidemia Father   . Hypertension Father   . Mental illness Father   . Diabetes Father   . Heart disease Father        stent   . Mental illness Sister   . Mental illness Brother   . Breast cancer Maternal Grandmother   . Lung cancer Maternal Grandmother   . Breast cancer Paternal Grandmother   . Mental illness Sister   . Hyperlipidemia Mother    Social History   Socioeconomic History  . Marital status: Married    Spouse name: Not on file  . Number of children: Not on file  . Years of education: Not on file  . Highest education level:  Not on file  Occupational History  . Not on file  Tobacco Use  . Smoking status: Current Every Day Smoker    Packs/day: 0.50    Years: 15.00    Pack years: 7.50    Types: Cigarettes  . Smokeless tobacco: Never Used  Vaping Use  . Vaping Use: Never used  Substance and Sexual Activity  . Alcohol use: Yes    Comment: 2 BEERS DAILY  . Drug use: No  . Sexual activity: Yes    Partners: Male    Birth control/protection: I.U.D.  Other Topics Concern  . Not on file  Social History Narrative  . Not on file   Social Determinants of Health   Financial Resource Strain: Not on file  Food Insecurity: Not on file  Transportation Needs: Not on file  Physical Activity: Not on file  Stress: Not on file  Social Connections: Not on file  Intimate Partner Violence: Not on file   Current Meds  Medication Sig  . alum & mag hydroxide-simeth (MAALOX/MYLANTA) 200-200-20 MG/5ML suspension Take 20 mLs by mouth as needed for indigestion or heartburn.  . doxycycline (VIBRA-TABS) 100 MG tablet Take 1 tablet (100 mg total) by mouth 2 (two) times daily. With food  . fluticasone (FLONASE) 50 MCG/ACT nasal spray Place 1-2 sprays into both  nostrils daily as needed for allergies or rhinitis.  . hydrOXYzine (VISTARIL) 25 MG capsule Take 1-2 capsules (25-50 mg total) by mouth every 8 (eight) hours as needed. Try at night 1-2 pills 1st  . ibuprofen (ADVIL) 600 MG tablet Take 1 tablet (600 mg total) by mouth every 8 (eight) hours as needed for mild pain or moderate pain (pain.).  Marland Kitchen loratadine (CLARITIN) 10 MG tablet Take 10 mg by mouth daily as needed for allergies.  . mupirocin ointment (BACTROBAN) 2 % Apply 1 application topically 3 (three) times daily. Open wounds abdomen  . omeprazole (PRILOSEC) 20 MG capsule Take 2 capsules (40 mg total) by mouth as needed.  . ondansetron (ZOFRAN) 4 MG tablet Take 1 tablet (4 mg total) by mouth every 8 (eight) hours as needed.  Marland Kitchen oxyCODONE (OXY IR/ROXICODONE) 5 MG immediate  release tablet Take 1 tablet (5 mg total) by mouth every 4 (four) hours as needed for severe pain.  Marland Kitchen PARAGARD INTRAUTERINE COPPER IUD IUD 1 each by Intrauterine route once.  . sertraline (ZOLOFT) 25 MG tablet Take 1 tablet (25 mg total) by mouth daily. (Patient taking differently: Take 37.5 mg by mouth daily with lunch.)  . triamcinolone (KENALOG) 0.1 % Apply 1 application topically 2 (two) times daily as needed. Not for face   Allergies  Allergen Reactions  . Tylenol [Acetaminophen] Other (See Comments)    Heart races  . Latex Rash  . Nickel Rash   Recent Results (from the past 2160 hour(s))  Sedimentation rate     Status: None   Collection Time: 04/06/20  9:22 AM  Result Value Ref Range   Sed Rate 6 0 - 20 mm/hr  CBC w/Diff     Status: Abnormal   Collection Time: 04/06/20  9:22 AM  Result Value Ref Range   WBC 5.0 4.0 - 10.5 K/uL   RBC 4.29 3.87 - 5.11 Mil/uL   Hemoglobin 11.8 (L) 12.0 - 15.0 g/dL   HCT 19.5 09.3 - 26.7 %   MCV 85.1 78.0 - 100.0 fl   MCHC 32.4 30.0 - 36.0 g/dL   RDW 12.4 58.0 - 99.8 %   Platelets 439.0 (H) 150.0 - 400.0 K/uL   Neutrophils Relative % 58.6 43.0 - 77.0 %   Lymphocytes Relative 30.8 12.0 - 46.0 %   Monocytes Relative 8.2 3.0 - 12.0 %   Eosinophils Relative 1.8 0.0 - 5.0 %   Basophils Relative 0.6 0.0 - 3.0 %   Neutro Abs 3.0 1.4 - 7.7 K/uL   Lymphs Abs 1.6 0.7 - 4.0 K/uL   Monocytes Absolute 0.4 0.1 - 1.0 K/uL   Eosinophils Absolute 0.1 0.0 - 0.7 K/uL   Basophils Absolute 0.0 0.0 - 0.1 K/uL  H. pylori antigen, stool     Status: None   Collection Time: 04/16/20 12:31 PM  Result Value Ref Range   H pylori Ag, Stl Negative Negative  SARS CORONAVIRUS 2 (TAT 6-24 HRS) Nasopharyngeal Nasopharyngeal Swab     Status: None   Collection Time: 05/05/20  1:00 PM   Specimen: Nasopharyngeal Swab  Result Value Ref Range   SARS Coronavirus 2 NEGATIVE NEGATIVE    Comment: (NOTE) SARS-CoV-2 target nucleic acids are NOT DETECTED.  The SARS-CoV-2 RNA is  generally detectable in upper and lower respiratory specimens during the acute phase of infection. Negative results do not preclude SARS-CoV-2 infection, do not rule out co-infections with other pathogens, and should not be used as the sole basis for treatment or other  patient management decisions. Negative results must be combined with clinical observations, patient history, and epidemiological information. The expected result is Negative.  Fact Sheet for Patients: HairSlick.nohttps://www.fda.gov/media/138098/download  Fact Sheet for Healthcare Providers: quierodirigir.comhttps://www.fda.gov/media/138095/download  This test is not yet approved or cleared by the [place] FDA and  has been authorized for detection and/or diagnosis of SARS-CoV-2 by FDA under an Emergency Use Authorization (EUA). This EUA will remain  in effect (meaning this test can be used) for the duration of the COVID-19 declaration under Se ction 564(b)(1) of the Act, 21 U.S.C. section 360bbb-3(b)(1), unless the authorization is terminated or revoked sooner.  Performed at [facility], [address] [place], [place] [address]   SARS Coronavirus 2 by RT PCR (hospital order, performed in [facility] hospital lab) Nasopharyngeal Nasopharyngeal Swab     Status: Abnormal   Collection Time: 05/07/20 10:41 AM   Specimen: Nasopharyngeal Swab  Result Value Ref Range   SARS Coronavirus 2 POSITIVE (A) NEGATIVE    Comment: RESULT CALLED TO, READ BACK BY AND VERIFIED WITH: CINDY LEWIS AT 1155 ON 05/07/20 BY GM (NOTE) SARS-CoV-2 target nucleic acids are DETECTED  SARS-CoV-2 RNA is generally detectable in upper respiratory specimens  during the acute phase of infection.  Positive results are indicative  of the presence of the identified virus, but do not rule out bacterial infection or co-infection with other pathogens not detected by the test.  Clinical correlation with patient history and  other diagnostic information is necessary to  determine patient infection status.  The expected result is negative.  Fact Sheet for Patients:   BoilerBrush.com.cyhttps://www.fda.gov/media/136312/download   Fact Sheet for Healthcare Providers:   https://pope.com/https://www.fda.gov/media/136313/download    This test is not yet approved or cleared by the [place] FDA and  has been authorized for detection and/or diagnosis of SARS-CoV-2 by FDA under an Emergency Use Authorization (EUA).  This EUA will remain in effect (meaning this t est can be used) for the duration of  the COVID-19 declaration under Section 564(b)(1) of the Act, 21 U.S.C. section 360-bbb-3(b)(1), unless the authorization is terminated or revoked sooner.  Performed at [facility], [address] [place], [place] [address]   Pregnancy, urine POC     Status: None   Collection Time: 05/26/20  6:11 AM  Result Value Ref Range   Preg Test, Ur NEGATIVE NEGATIVE    Comment:        THE SENSITIVITY OF THIS METHODOLOGY IS >24 mIU/mL   Surgical pathology     Status: None   Collection Time: 05/26/20  6:53 AM  Result Value Ref Range   SURGICAL PATHOLOGY      SURGICAL PATHOLOGY CASE: [id number] PATIENT: Hazell Fason Surgical Pathology Report     Specimen Submitted: A. Gallbladder  Clinical History: Biliary dyskinesia      DIAGNOSIS: A. GALLBLADDER, CHOLECYSTECTOMY: - CHRONIC CHOLECYSTITIS, MILD. - NEGATIVE FOR DYSPLASIA AND MALIGNANCY.   GROSS DESCRIPTION: A. Labeled: Gallbladder Received: Formalin Collection time: 8:48 AM on 05/26/2020 Placed into formalin time: 8:55 AM on 05/26/2020 Size of specimen: 6.7 x 3.2 x 2.7 cm Specimen integrity: Intact External surface: The serosa is tan-green, smooth, and glistening with a roughened hepatic bed. Wall thickness: Ranges from 0.1-0.2 cm Mucosa: The mucosa is tan-green and velvety.  Cholesterolosis is absent. Cystic duct: The cystic duct is a 1.2 x 0.4 x 0.4 cm.  The duct is tortuous but patent.  A distinct adjacent  lymph node candidate is not grossly appreciated. Bile present:  There is tan-green viscous bile. Stones present: None grossly appreciated. Oth er findings: None grossly appreciated.  Block summary: 1 - cystic duct resection margin, en face and inked blue, with representative wall   Final Diagnosis performed by Georgeanna Harrison, MD.   Electronically signed 05/27/2020 8:30:30AM The electronic signature indicates that the named Attending Pathologist has evaluated the specimen Technical component performed at [facility], [address] [place], [place] [address] Lab: [phone] Dir: Jolene Schimke, MD, MMM  Professional component performed at [facility], [address] [place], [place], [place] [address] Lab: [phone] Dir: Georgiann Cocker. Rubinas, MD    Objective  Body mass index is 23.89 kg/m. Wt Readings from Last 3 Encounters:  07/02/20 148 lb (67.1 kg)  06/11/20 147 lb (66.7 kg)  05/26/20 150 lb (68 kg)   Temp Readings from Last 3 Encounters:  06/11/20 97.9 F (36.6 C) (Oral)  05/26/20 (!) 97 F (36.1 C) (Temporal)  04/27/20 98.2 F (36.8 C)   BP Readings from Last 3 Encounters:  07/02/20 116/76  06/11/20 122/82  05/26/20 130/80   Pulse Readings from Last 3 Encounters:  07/02/20 83  06/11/20 74  05/26/20 85    Physical Exam Vitals and nursing note reviewed.  Constitutional:      Appearance: Normal appearance. She is well-developed and well-groomed.  HENT:     Head: Normocephalic and atraumatic.  Eyes:     Conjunctiva/sclera: Conjunctivae normal.     Pupils: Pupils are equal, round, and reactive to light.  Cardiovascular:     Rate and Rhythm: Normal rate.  Pulmonary:     Effort: Pulmonary effort is normal.  Abdominal:    Skin:    General: Skin is warm and moist.     Comments: Open wound x 4    Neurological:     General: No focal deficit present.     Mental Status: She is alert and oriented to person, place, and time. Mental status is at  baseline.     Gait: Gait normal.  Psychiatric:        Attention and Perception: Attention and perception normal.        Mood and Affect: Mood and affect normal.        Speech: Speech normal.        Behavior: Behavior normal. Behavior is cooperative.        Thought Content: Thought content normal.        Cognition and Memory: Cognition normal.        Judgment: Judgment normal.     Assessment  Plan  Rash likely cellulitis are prior GB surgery sites with contact dermatitis from bandaids and hives over trunk and extremities/face - Plan: methylPREDNISolone acetate (DEPO-MEDROL) injection 40 mg x 2, triamcinolone (KENALOG) 0.1 %,   Open wound - Plan: mupirocin ointment (BACTROBAN) 2 %, doxycycline (VIBRA-TABS) 100 MG tablet bid x 7-10 days  Hives - Plan: hydrOXYzine (VISTARIL) 25 MG capsule tid prn   Nausea - Plan: ondansetron (ZOFRAN) 4 MG tablet   Provider: Dr. French Ana McLean-Scocuzza-Internal Medicine

## 2020-07-02 NOTE — Patient Instructions (Addendum)
Dove antibacterial body wash focusing on belly       Consider dermatology in the future if hives return   Hives Hives (urticaria) are itchy, red, swollen areas on the skin. Hives can appear on any part of the body. Hives often fade within 24 hours (acute hives). Sometimes, new hives appear after old ones fade and the cycle can continue for several days or weeks (chronic hives). Hives do not spread from person to person (are not contagious). Hives come from the body's reaction to something a person is allergic to (allergen), something that causes irritation, or various other triggers. When a person is exposed to a trigger, his or her body releases a chemical (histamine) that causes redness, itching, and swelling. Hives can appear right after exposure to a trigger or hours later. What are the causes? This condition may be caused by:  Allergies to foods or ingredients.  Insect bites or stings.  Exposure to pollen or pets.  Contact with latex or chemicals.  Spending time in sunlight, heat, or cold (exposure).  Exercise.  Stress.  Certain medicines. You can also get hives from other medical conditions and treatments, such as:  Viruses, including the common cold.  Bacterial infections, such as urinary tract infections and strep throat.  Certain medicines.  Allergy shots.  Blood transfusions. Sometimes, the cause of this condition is not known (idiopathic hives). What increases the risk? You are more likely to develop this condition if you:  Are a woman.  Have food allergies, especially to citrus fruits, milk, eggs, peanuts, tree nuts, or shellfish.  Are allergic to: ? Medicines. ? Latex. ? Insects. ? Animals. ? Pollen. What are the signs or symptoms? Common symptoms of this condition include raised, itchy, red or white bumps or patches on your skin. These areas may:  Become large and swollen (welts).  Change in shape and location, quickly and repeatedly.  Be separate  hives or connect over a large area of skin.  Sting or become painful.  Turn white when pressed in the center (blanch). In severe cases, yourhands, feet, and face may also become swollen. This may occur if hives develop deeper in your skin.   How is this diagnosed? This condition may be diagnosed by your symptoms, medical history, and physical exam.  Your skin, urine, or blood may be tested to find out what is causing your hives and to rule out other health issues.  Your health care provider may also remove a small sample of skin from the affected area and examine it under a microscope (biopsy). How is this treated? Treatment for this condition depends on the cause and severity of your symptoms. Your health care provider may recommend using cool, wet cloths (cool compresses) or taking cool showers to relieve itching. Treatment may include:  Medicines that help: ? Relieve itching (antihistamines). ? Reduce swelling (corticosteroids). ? Treat infection (antibiotics).  An injectable medicine (omalizumab). Your health care provider may prescribe this if you have chronic idiopathic hives and you continue to have symptoms even after treatment with antihistamines. Severe cases may require an emergency injection of adrenaline (epinephrine) to prevent a life-threatening allergic reaction (anaphylaxis). Follow these instructions at home: Medicines  Take and apply over-the-counter and prescription medicines only as told by your health care provider.  If you were prescribed an antibiotic medicine, take it as told by your health care provider. Do not stop using the antibiotic even if you start to feel better. Skin care  Apply cool compresses to  the affected areas.  Do not scratch or rub your skin. General instructions  Do not take hot showers or baths. This can make itching worse.  Do not wear tight-fitting clothing.  Use sunscreen and wear protective clothing when you are outside.  Avoid  any substances that cause your hives. Keep a journal to help track what causes your hives. Write down: ? What medicines you take. ? What you eat and drink. ? What products you use on your skin.  Keep all follow-up visits as told by your health care provider. This is important. Contact a health care provider if:  Your symptoms are not controlled with medicine.  Your joints are painful or swollen. Get help right away if:  You have a fever.  You have pain in your abdomen.  Your tongue or lips are swollen.  Your eyelids are swollen.  Your chest or throat feels tight.  You have trouble breathing or swallowing. These symptoms may represent a serious problem that is an emergency. Do not wait to see if the symptoms will go away. Get medical help right away. Call your local emergency services (911 in the [place] Do not drive yourself to the hospital. Summary  Hives (urticaria) are itchy, red, swollen areas on your skin. Hives come from the body's reaction to something a person is allergic to (allergen), something that causes irritation, or various other triggers.  Treatment for this condition depends on the cause and severity of your symptoms.  Avoid any substances that cause your hives. Keep a journal to help track what causes your hives.  Take and apply over-the-counter and prescription medicines only as told by your health care provider.  Keep all follow-up visits as told by your health care provider. This is important. This information is not intended to replace advice given to you by your health care provider. Make sure you discuss any questions you have with your health care provider. Document Revised: 10/04/2017 Document Reviewed: 10/04/2017 Elsevier Patient Education  2021 Elsevier Inc.   Wound Care, Adult Taking care of your wound properly can help to prevent pain, infection, and scarring. It can also help your wound heal more quickly. Follow instructions from your health  care provider about how to care for your wound. Supplies needed:  Soap and water.  Wound cleanser.  Gauze.  If needed, a clean bandage (dressing) or other type of wound dressing material to cover or place in the wound. Follow your health care provider's instructions about what dressing supplies to use.  Cream or ointment to apply to the wound, if told by your health care provider. How to care for your wound Cleaning the wound Ask your health care provider how to clean the wound. This may include:  Using mild soap and water or a wound cleanser.  Using a clean gauze to pat the wound dry after cleaning it. Do not rub or scrub the wound. Dressing care  Wash your hands with soap and water for at least 20 seconds before and after you change the dressing. If soap and water are not available, use hand sanitizer.  Change your dressing as told by your health care provider. This may include: ? Cleaning or rinsing out (irrigating) the wound. ? Placing a dressing over the wound or in the wound (packing). ? Covering the wound with an outer dressing.  Leave any stitches (sutures), skin glue, or adhesive strips in place. These skin closures may need to stay in place for 2 weeks or longer. If  adhesive strip edges start to loosen and curl up, you may trim the loose edges. Do not remove adhesive strips completely unless your health care provider tells you to do that.  Ask your health care provider when you can leave the wound uncovered. Checking for infection Check your wound area every day for signs of infection. Check for:  More redness, swelling, or pain.  Fluid or blood.  Warmth.  Pus or a bad smell.   Follow these instructions at home Medicines  If you were prescribed an antibiotic medicine, cream, or ointment, take or apply it as told by your health care provider. Do not stop using the antibiotic even if your condition improves.  If you were prescribed pain medicine, take it 30  minutes before you do any wound care or as told by your health care provider.  Take over-the-counter and prescription medicines only as told by your health care provider. Eating and drinking  Eat a diet that includes protein, vitamin A, vitamin C, and other nutrient-rich foods to help the wound heal. ? Foods rich in protein include meat, fish, eggs, dairy, beans, and nuts. ? Foods rich in vitamin A include carrots and dark green, leafy vegetables. ? Foods rich in vitamin C include citrus fruits, tomatoes, broccoli, and peppers.  Drink enough fluid to keep your urine pale yellow. General instructions  Do not take baths, swim, use a hot tub, or do anything that would put the wound underwater until your health care provider approves. Ask your health care provider if you may take showers. You may only be allowed to take sponge baths.  Do not scratch or pick at the wound. Keep it covered as told by your health care provider.  Return to your normal activities as told by your health care provider. Ask your health care provider what activities are safe for you.  Protect your wound from the sun when you are outside for the first 6 months, or for as long as told by your health care provider. Cover up the scar area or apply sunscreen that has an SPF of at least 30.  Do not use any products that contain nicotine or tobacco, such as cigarettes, e-cigarettes, and chewing tobacco. These may delay wound healing. If you need help quitting, ask your health care provider.  Keep all follow-up visits as told by your health care provider. This is important. Contact a health care provider if:  You received a tetanus shot and you have swelling, severe pain, redness, or bleeding at the injection site.  Your pain is not controlled with medicine.  You have any of these signs of infection: ? More redness, swelling, or pain around the wound. ? Fluid or blood coming from the wound. ? Warmth coming from the  wound. ? Pus or a bad smell coming from the wound. ? A fever or chills.  You are nauseous or you vomit.  You are dizzy. Get help right away if:  You have a red streak of skin near the area around your wound.  Your wound has been closed with staples, sutures, skin glue, or adhesive strips and it begins to open up and separate.  Your wound is bleeding, and the bleeding does not stop with gentle pressure.  You have a rash.  You faint.  You have trouble breathing. These symptoms may represent a serious problem that is an emergency. Do not wait to see if the symptoms will go away. Get medical help right away. Call your local  emergency services (911 in the [place] Do not drive yourself to the hospital. Summary  Always wash your hands with soap and water for at least 20 seconds before and after changing your dressing.  Change your dressing as told by your health care provider.  To help with healing, eat foods that are rich in protein, vitamin A, vitamin C, and other nutrients.  Check your wound every day for signs of infection. Contact your health care provider if you suspect that your wound is infected. This information is not intended to replace advice given to you by your health care provider. Make sure you discuss any questions you have with your health care provider. Document Revised: 01/04/2019 Document Reviewed: 01/04/2019 Elsevier Patient Education  2021 [employer].

## 2020-08-07 ENCOUNTER — Other Ambulatory Visit: Payer: Self-pay

## 2020-08-07 MED FILL — Sertraline HCl Tab 25 MG: ORAL | 90 days supply | Qty: 135 | Fill #0 | Status: AC

## 2020-08-11 ENCOUNTER — Other Ambulatory Visit: Payer: Self-pay

## 2020-08-11 ENCOUNTER — Other Ambulatory Visit: Payer: Self-pay | Admitting: Internal Medicine

## 2020-08-11 MED ORDER — PREDNISONE 10 MG PO TABS
ORAL_TABLET | ORAL | 0 refills | Status: DC
  Filled 2020-08-11: qty 33, 8d supply, fill #0

## 2020-08-11 MED ORDER — AZITHROMYCIN 500 MG PO TABS
500.0000 mg | ORAL_TABLET | Freq: Every day | ORAL | 0 refills | Status: DC
  Filled 2020-08-11: qty 7, 7d supply, fill #0

## 2020-11-12 LAB — RESULTS CONSOLE HPV: CHL HPV: NEGATIVE

## 2020-11-16 LAB — HM PAP SMEAR: HM Pap smear: NORMAL

## 2020-12-02 ENCOUNTER — Ambulatory Visit (INDEPENDENT_AMBULATORY_CARE_PROVIDER_SITE_OTHER): Payer: No Typology Code available for payment source | Admitting: Family

## 2020-12-02 ENCOUNTER — Other Ambulatory Visit: Payer: Self-pay

## 2020-12-02 DIAGNOSIS — R42 Dizziness and giddiness: Secondary | ICD-10-CM | POA: Diagnosis not present

## 2020-12-02 DIAGNOSIS — R1013 Epigastric pain: Secondary | ICD-10-CM

## 2020-12-02 DIAGNOSIS — R002 Palpitations: Secondary | ICD-10-CM

## 2020-12-02 LAB — COMPREHENSIVE METABOLIC PANEL
ALT: 15 U/L (ref 0–35)
AST: 16 U/L (ref 0–37)
Albumin: 4.1 g/dL (ref 3.5–5.2)
Alkaline Phosphatase: 72 U/L (ref 39–117)
BUN: 11 mg/dL (ref 6–23)
CO2: 28 mEq/L (ref 19–32)
Calcium: 9.6 mg/dL (ref 8.4–10.5)
Chloride: 102 mEq/L (ref 96–112)
Creatinine, Ser: 0.8 mg/dL (ref 0.40–1.20)
GFR: 96.45 mL/min (ref >60.00)
Glucose, Bld: 97 mg/dL (ref 70–99)
Potassium: 4.1 mEq/L (ref 3.5–5.1)
Sodium: 137 mEq/L (ref 135–145)
Total Bilirubin: 0.5 mg/dL (ref 0.2–1.2)
Total Protein: 7 g/dL (ref 6.0–8.3)

## 2020-12-02 NOTE — Addendum Note (Signed)
Addended by: Warden Fillers on: 12/02/2020 12:03 PM   Modules accepted: Orders

## 2020-12-02 NOTE — Progress Notes (Signed)
Subjective:    Patient ID: Hannah Snow, female    DOB: Sep 13, 1985, 35 y.o.   MRN: [id number]  CC: Hannah Snow is a 35 y.o. female who presents today for an acute visit.    HPI: Complains of not feeling well the past couple days.  She describes as 'focusing issue' that makes her dizzy. Dizziness worse with movement. She has anxiety daily and considered that this may be contributory.  She describes episodic dizziness and palpitations.  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    She also complains  episode of watery diarrhea this morning as well. Nonbloody. This has been ongoing for 4 days ago on onset correlated to after attending and eating at a wedding 5 days ago with outdoor, buffet style food. She had alcohol at wedding. Husband had 'felt sick to his stomach' after wedding. Endorses nausea and dry heaving.   No vomiting, abdominal  vision loss, vision changes, HA, fever, vertigo, hearing loss, tinnitus, sinus congestion, dysuria.     She had breakfast this morning.  20 ounce cup caffeine this morning. 8 ounce of water today.   Evaluation for palpitation  Consult with Dr Kirke Corin.  No syncope.  Evaluated 05/2018 by Dr Kirke Corin for palpitations. He ordered a ZIO patch monitor for 2 weeks at that time which showed  3 brief episodes of supraventricular tachycardia.EKG showed short PR interval; he did not suspect WPW syndrome.   cholecystectomy 05/26/2020  Labs 11/12/20 revealed no electrolyte disturbances, normal renal function, no anemia  Restarted zoloft 25mg  last week and plans  to increase to 37.5mg  by Dr  HISTORY:  Past Medical History:  Diagnosis Date   Anemia    Anxiety    Depression    GERD (gastroesophageal reflux disease)    History of kidney stones    H/O   Past Surgical History:  Procedure Laterality Date   CESAREAN SECTION     CESAREAN SECTION N/A 02/02/2015   Procedure: CESAREAN SECTION;   Surgeon: 02/04/2015, MD;  Location: [facility];  Service: Obstetrics;  Laterality: N/A;   Family History  Problem Relation Age of Onset   Hyperlipidemia Father    Hypertension Father    Mental illness Father    Diabetes Father    Heart disease Father        stent    Mental illness Sister    Mental illness Brother    Breast cancer Maternal Grandmother    Lung cancer Maternal Grandmother    Breast cancer Paternal Grandmother    Mental illness Sister    Hyperlipidemia Mother     Allergies: Tylenol [acetaminophen], Latex, and Nickel Current Outpatient Medications on File Prior to Visit  Medication Sig Dispense Refill   alum & mag hydroxide-simeth (MAALOX/MYLANTA) 200-200-20 MG/5ML suspension Take 20 mLs by mouth as needed for indigestion or heartburn.     azithromycin (ZITHROMAX) 500 MG tablet Take 1 tablet (500 mg total) by mouth daily. 7 tablet 0   doxycycline (VIBRA-TABS) 100 MG tablet TAKE 1 TABLET (100 MG TOTAL) BY MOUTH 2 (TWO) TIMES DAILY FOR 7 TO 10 DAYS WITH FOOD. 20 tablet 0   doxycycline (VIBRA-TABS) 100 MG tablet TAKE 1 TABLET (100 MG TOTAL) BY MOUTH 2 (TWO) TIMES DAILY. TAKE WITH FOOD . 20 tablet 0   fluticasone (FLONASE) 50 MCG/ACT nasal spray Place 1-2 sprays into both nostrils daily as needed for allergies or rhinitis.     hydrOXYzine (  VISTARIL) 25 MG capsule TAKE 1 TO 2 CAPSULES (25-50 MG TOTAL) BY MOUTH EVERY 8 (EIGHT) HOURS AS NEEDED. TRY AT NIGHT 1 TO 2 CAPSULES FIRST. 90 capsule 0   ibuprofen (ADVIL) 600 MG tablet TAKE 1 TABLET BY MOUTH EVERY 8 (EIGHT) HOURS AS NEEDED FOR MILD PAIN OR MODERATE PAIN 60 tablet 1   loratadine (CLARITIN) 10 MG tablet Take 10 mg by mouth daily as needed for allergies.     mupirocin ointment (BACTROBAN) 2 % APPLY TO THE AFFECTED AREA(S) OF OPEN WOUNDS ON ABDOMEN THREE TIMES DAILY 30 g 0   omeprazole (PRILOSEC) 20 MG capsule TAKE 2 CAPSULES (40 MG) BY MOUTH AS NEEDED. 180 capsule 3   omeprazole (PRILOSEC) 20 MG capsule TAKE 2  CAPSULES (40 MG) BY MOUTH ONCE DAILY 60 capsule 2   ondansetron (ZOFRAN) 4 MG tablet TAKE 1 TABLET (4 MG TOTAL) BY MOUTH EVERY 8 (EIGHT) HOURS AS NEEDED. 40 tablet 0   PARAGARD INTRAUTERINE COPPER IUD IUD 1 each by Intrauterine route once.     polyethylene glycol (GOLYTELY) 236 g solution TAKE ACCORDING TO PREP INSTRUCTIONS FROM PROVIDER 4000 mL 0   predniSONE (DELTASONE) 10 MG tablet 6 tablets daily for 3 days, then reduce by 1 tablet daily until gone 33 tablet 0   sertraline (ZOLOFT) 25 MG tablet Take 1 tablet (25 mg total) by mouth daily. (Patient taking differently: Take 37.5 mg by mouth daily with lunch.) 90 tablet 1   sertraline (ZOLOFT) 25 MG tablet TAKE 1 & 1/2 TABLETS (37.5 MG TOTAL) BY MOUTH ONCE DAILY 135 tablet 3   triamcinolone (KENALOG) 0.1 % APPLY TO AFFECTED AREAS TWO TIMES DAILY AS NEEDED. NOT FOR FACE. 454 g 1   [DISCONTINUED] dicyclomine (BENTYL) 10 MG capsule Take 1 capsule (10 mg total) by mouth 4 (four) times daily -  before meals and at bedtime. (Patient not taking: Reported on 04/28/2020) 60 capsule 0   No current facility-administered medications on file prior to visit.    Social History   Tobacco Use   Smoking status: Every Day    Packs/day: 0.50    Years: 15.00    Pack years: 7.50    Types: Cigarettes   Smokeless tobacco: Never  Vaping Use   Vaping Use: Never used  Substance Use Topics   Alcohol use: Yes    Comment: 2 BEERS DAILY   Drug use: No    Review of Systems  Constitutional:  Negative for chills, fatigue and fever.  HENT:  Negative for congestion, ear pain, sinus pressure and sinus pain.   Eyes:  Negative for visual disturbance.  Respiratory:  Negative for cough.   Cardiovascular:  Negative for chest pain and palpitations.  Gastrointestinal:  Positive for diarrhea and nausea. Negative for abdominal pain and vomiting.  Neurological:  Positive for dizziness. Negative for headaches.     Objective:    There were no vitals taken for this  visit.   Physical Exam Vitals reviewed.  Constitutional:      Appearance: Normal appearance. She is well-developed.  HENT:     Head: Normocephalic and atraumatic.     Right Ear: Hearing, tympanic membrane, ear canal and external ear normal. No decreased hearing noted. No drainage, swelling or tenderness. No middle ear effusion. No foreign body. Tympanic membrane is not erythematous or bulging.     Left Ear: Hearing, tympanic membrane, ear canal and external ear normal. No decreased hearing noted. No drainage, swelling or tenderness.  No middle ear effusion. No  foreign body. Tympanic membrane is not erythematous or bulging.     Nose: Nose normal. No rhinorrhea.     Right Sinus: No maxillary sinus tenderness or frontal sinus tenderness.     Left Sinus: No maxillary sinus tenderness or frontal sinus tenderness.     Mouth/Throat:     Pharynx: Uvula midline. No oropharyngeal exudate or posterior oropharyngeal erythema.     Tonsils: No tonsillar abscesses.  Eyes:     Conjunctiva/sclera: Conjunctivae normal.  Cardiovascular:     Rate and Rhythm: Normal rate and regular rhythm.     Pulses: Normal pulses.     Heart sounds: Normal heart sounds.  Pulmonary:     Effort: Pulmonary effort is normal.     Breath sounds: Normal breath sounds. No wheezing, rhonchi or rales.  Abdominal:     General: Bowel sounds are normal. There is no distension.     Palpations: Abdomen is soft. Abdomen is not rigid. There is no fluid wave or mass.     Tenderness: There is no abdominal tenderness. There is no guarding or rebound.  Lymphadenopathy:     Head:     Right side of head: No submental, submandibular, tonsillar, preauricular, posterior auricular or occipital adenopathy.     Left side of head: No submental, submandibular, tonsillar, preauricular, posterior auricular or occipital adenopathy.     Cervical: No cervical adenopathy.  Skin:    General: Skin is warm and dry.  Neurological:     Mental Status: She  is alert.  Psychiatric:        Speech: Speech normal.        Behavior: Behavior normal.        Thought Content: Thought content normal.       Assessment & Plan:   Problem List Items Addressed This Visit       Other   Dizziness - Primary    Resolved as patient rested in exam room.  Reassuring abdominal exam.  Symptom not consistent with vertigo. EKG shows SR and no significant changes from prior with Dr Kirke CorinArida 05/24/20. Dr Kirke CorinArida commented on PR interval and he wasn't concerned for WPW syndrome. Pending Labs for electrolyte disturbances and stool pathogen panel.  Differentials include dehydration, gastritis exacerbating dehydration, caffeine use, anxiety, as well as side effects from recently restarted Zoloft 37.5 mg.  Discussed common side effects of Zoloft the diarrhea, and dizziness.  Will await stool studies and labs and advised patient if palpitations or dizziness were to increase or change in any way to let me know soon as possible.  Refer back to cardiology for palpitations persistent for repeat evaluation.  Follow-up in 6 weeks      Relevant Orders   Comprehensive metabolic panel (Completed)   Gastrointestinal Pathogen Panel PCR      I am having Corynne L. Paye maintain her paragard intrauterine copper, sertraline, loratadine, fluticasone, alum & mag hydroxide-simeth, doxycycline, ondansetron, doxycycline, mupirocin ointment, triamcinolone cream, hydrOXYzine, omeprazole, ibuprofen, polyethylene glycol, sertraline, omeprazole, predniSONE, and azithromycin.   No orders of the defined types were placed in this encounter.   Return precautions given.   Risks, benefits, and alternatives of the medications and treatment plan prescribed today were discussed, and patient expressed understanding.   Education regarding symptom management and diagnosis given to patient on AVS.  Continue to follow with Sherlene Shamsullo, Teresa L, MD for routine health maintenance.   Geni BersJessica L Fugitt and I  agreed with plan.   Rennie PlowmanMargaret Labron Bloodgood, FNP  I have spent 26  minutes with a patient including precharting, exam, reviewing medical records, and discussion plan of care.

## 2020-12-02 NOTE — Assessment & Plan Note (Signed)
Resolved as patient rested in exam room.  Reassuring abdominal exam.  Symptom not consistent with vertigo. EKG shows SR and no significant changes from prior with Dr Kirke Corin 05/24/20. Dr Kirke Corin commented on PR interval and he wasn't concerned for WPW syndrome. Pending Labs for electrolyte disturbances and stool pathogen panel.  Differentials include dehydration, gastritis exacerbating dehydration, caffeine use, anxiety, as well as side effects from recently restarted Zoloft 37.5 mg.  Discussed common side effects of Zoloft the diarrhea, and dizziness.  Will await stool studies and labs and advised patient if palpitations or dizziness were to increase or change in any way to let me know soon as possible.  Refer back to cardiology for palpitations persistent for repeat evaluation.  Follow-up in 6 weeks

## 2020-12-02 NOTE — Patient Instructions (Addendum)
Stool studies-please return ASAP  Increasing water. I would hold coffee for a couple of days until you are feeling better.   Consider zoloft side effects or anxiety as we discussed.   Please let me know how you are doing, certainly if you develop new or worsening symptoms and if you need anything at all.

## 2020-12-03 NOTE — Addendum Note (Signed)
Addended by: Warden Fillers on: 12/03/2020 12:00 PM   Modules accepted: Orders

## 2020-12-09 LAB — GASTROINTESTINAL PATHOGEN PANEL PCR
C. difficile Tox A/B, PCR: NOT DETECTED
Campylobacter, PCR: NOT DETECTED
Cryptosporidium, PCR: NOT DETECTED
E coli (ETEC) LT/ST PCR: NOT DETECTED
E coli (STEC) stx1/stx2, PCR: NOT DETECTED
E coli 0157, PCR: NOT DETECTED
Giardia lamblia, PCR: NOT DETECTED
Norovirus, PCR: NOT DETECTED
Rotavirus A, PCR: NOT DETECTED
Salmonella, PCR: NOT DETECTED
Shigella, PCR: NOT DETECTED

## 2020-12-09 LAB — OVA AND PARASITE EXAMINATION
CONCENTRATE RESULT:: NONE SEEN
MICRO NUMBER:: 12321939
SPECIMEN QUALITY:: ADEQUATE
TRICHROME RESULT:: NONE SEEN

## 2020-12-28 ENCOUNTER — Other Ambulatory Visit: Payer: Self-pay | Admitting: Internal Medicine

## 2020-12-28 ENCOUNTER — Other Ambulatory Visit: Payer: Self-pay

## 2020-12-28 MED ORDER — BENZONATATE 200 MG PO CAPS
200.0000 mg | ORAL_CAPSULE | Freq: Three times a day (TID) | ORAL | 1 refills | Status: DC | PRN
  Filled 2020-12-28: qty 60, 20d supply, fill #0

## 2020-12-29 ENCOUNTER — Other Ambulatory Visit: Payer: Self-pay | Admitting: Internal Medicine

## 2020-12-29 ENCOUNTER — Other Ambulatory Visit: Payer: Self-pay

## 2020-12-29 MED ORDER — PREDNISONE 10 MG PO TABS
ORAL_TABLET | ORAL | 0 refills | Status: DC
  Filled 2020-12-29: qty 33, 8d supply, fill #0

## 2020-12-31 ENCOUNTER — Other Ambulatory Visit: Payer: Self-pay | Admitting: Internal Medicine

## 2020-12-31 ENCOUNTER — Other Ambulatory Visit: Payer: Self-pay

## 2020-12-31 ENCOUNTER — Ambulatory Visit (INDEPENDENT_AMBULATORY_CARE_PROVIDER_SITE_OTHER): Payer: No Typology Code available for payment source

## 2020-12-31 DIAGNOSIS — R918 Other nonspecific abnormal finding of lung field: Secondary | ICD-10-CM | POA: Diagnosis not present

## 2020-12-31 DIAGNOSIS — J40 Bronchitis, not specified as acute or chronic: Secondary | ICD-10-CM

## 2020-12-31 MED ORDER — ALBUTEROL SULFATE (2.5 MG/3ML) 0.083% IN NEBU
2.5000 mg | INHALATION_SOLUTION | Freq: Four times a day (QID) | RESPIRATORY_TRACT | 0 refills | Status: DC | PRN
  Filled 2020-12-31: qty 75, 7d supply, fill #0

## 2020-12-31 NOTE — Progress Notes (Signed)
Albuterol nebs and chest x ray ordered,  for abnormal lung exam LLL pneumonia suspected

## 2021-02-22 ENCOUNTER — Other Ambulatory Visit: Payer: No Typology Code available for payment source

## 2021-02-22 ENCOUNTER — Other Ambulatory Visit: Payer: Self-pay | Admitting: Internal Medicine

## 2021-02-22 DIAGNOSIS — J069 Acute upper respiratory infection, unspecified: Secondary | ICD-10-CM

## 2021-02-25 LAB — RESPIRATORY VIRUS PANEL
Adenovirus B: NOT DETECTED
HUMAN PARAINFLU VIRUS 1: NOT DETECTED
HUMAN PARAINFLU VIRUS 2: NOT DETECTED
HUMAN PARAINFLU VIRUS 3: NOT DETECTED
INFLUENZA A SUBTYPE H1: NOT DETECTED
INFLUENZA A SUBTYPE H3: DETECTED — AB
Influenza A: DETECTED — AB
Influenza B: NOT DETECTED
Metapneumovirus: NOT DETECTED
Respiratory Syncytial Virus A: NOT DETECTED
Respiratory Syncytial Virus B: NOT DETECTED
Rhinovirus: NOT DETECTED

## 2021-04-15 ENCOUNTER — Other Ambulatory Visit: Payer: Self-pay | Admitting: Internal Medicine

## 2021-04-15 ENCOUNTER — Ambulatory Visit (INDEPENDENT_AMBULATORY_CARE_PROVIDER_SITE_OTHER): Payer: No Typology Code available for payment source | Admitting: Internal Medicine

## 2021-04-15 ENCOUNTER — Other Ambulatory Visit: Payer: Self-pay

## 2021-04-15 ENCOUNTER — Encounter: Payer: Self-pay | Admitting: Internal Medicine

## 2021-04-15 VITALS — BP 118/78 | HR 78 | Temp 98.0°F | Resp 17 | Ht 66.0 in | Wt 160.0 lb

## 2021-04-15 DIAGNOSIS — R42 Dizziness and giddiness: Secondary | ICD-10-CM

## 2021-04-15 DIAGNOSIS — R002 Palpitations: Secondary | ICD-10-CM | POA: Diagnosis not present

## 2021-04-15 DIAGNOSIS — R519 Headache, unspecified: Secondary | ICD-10-CM

## 2021-04-15 LAB — CBC WITH DIFFERENTIAL/PLATELET
Basophils Absolute: 0.1 10*3/uL (ref 0.0–0.1)
Basophils Relative: 0.6 % (ref 0.0–3.0)
Eosinophils Absolute: 0.1 10*3/uL (ref 0.0–0.7)
Eosinophils Relative: 1 % (ref 0.0–5.0)
HCT: 36.6 % (ref 36.0–46.0)
Hemoglobin: 11.8 g/dL — ABNORMAL LOW (ref 12.0–15.0)
Lymphocytes Relative: 18 % (ref 12.0–46.0)
Lymphs Abs: 1.6 10*3/uL (ref 0.7–4.0)
MCHC: 32.3 g/dL (ref 30.0–36.0)
MCV: 87.1 fl (ref 78.0–100.0)
Monocytes Absolute: 0.6 10*3/uL (ref 0.1–1.0)
Monocytes Relative: 6.5 % (ref 3.0–12.0)
Neutro Abs: 6.6 10*3/uL (ref 1.4–7.7)
Neutrophils Relative %: 73.9 % (ref 43.0–77.0)
Platelets: 372 10*3/uL (ref 150.0–400.0)
RBC: 4.2 Mil/uL (ref 3.87–5.11)
RDW: 17.3 % — ABNORMAL HIGH (ref 11.5–15.5)
WBC: 8.9 10*3/uL (ref 4.0–10.5)

## 2021-04-15 LAB — BASIC METABOLIC PANEL
BUN: 11 mg/dL (ref 6–23)
CO2: 29 mEq/L (ref 19–32)
Calcium: 8.8 mg/dL (ref 8.4–10.5)
Chloride: 103 mEq/L (ref 96–112)
Creatinine, Ser: 0.77 mg/dL (ref 0.40–1.20)
GFR: 100.72 mL/min (ref >60.00)
Glucose, Bld: 95 mg/dL (ref 70–99)
Potassium: 4.1 mEq/L (ref 3.5–5.1)
Sodium: 136 mEq/L (ref 135–145)

## 2021-04-15 LAB — MAGNESIUM: Magnesium: 2 mg/dL (ref 1.5–2.5)

## 2021-04-15 LAB — TSH: TSH: 1.62 u[IU]/mL (ref 0.35–5.50)

## 2021-04-15 MED ORDER — MAGNESIUM OXIDE -MG SUPPLEMENT 400 (240 MG) MG PO TABS
400.0000 mg | ORAL_TABLET | Freq: Every day | ORAL | 1 refills | Status: DC
  Filled 2021-04-15: qty 30, 30d supply, fill #0

## 2021-04-15 MED FILL — Sertraline HCl Tab 25 MG: ORAL | 90 days supply | Qty: 135 | Fill #0 | Status: AC

## 2021-04-15 NOTE — Progress Notes (Signed)
Patient ID: Hannah Snow, female   DOB: July 13, 1985, 36 y.o.   MRN: [id number]   Subjective:    Patient ID: Hannah Snow, female    DOB: 1985/06/11, 36 y.o.   MRN: [id number]  This visit occurred during the SARS-CoV-2 public health emergency.  Safety protocols were in place, including screening questions prior to the visit, additional usage of staff PPE, and extensive cleaning of exam room while observing appropriate contact time as indicated for disinfecting solutions.   Patient here for work in appt.   Chief Complaint  Patient presents with   Palpitations   .   HPI Work in with concerns regarding intermittent palpitations and associated feeling of light headedness.  Started Sunday 04/11/21.  No known trigger.  No significant increased stress. She has started back on her zoloft in the last week.  Also has noticed increased acid reflux.  Started back on prilosec this past week as well.  Has had issues with acid reflux for years - requiring prilosec (intermittent).  Stays active.  No chest pain or sob reported.  Eating.  No nausea or vomiting.  Had influenza recently.  No other illness.  Does report headache over the last several days.  Not severe.  Ibuprofen does not help.  Discussed sleep.  Discussed eye exam.  Reports family history of early heart disease - father and his siblings.    Past Medical History:  Diagnosis Date   Anemia    Anxiety    Depression    GERD (gastroesophageal reflux disease)    History of kidney stones    H/O   Past Surgical History:  Procedure Laterality Date   CESAREAN SECTION     CESAREAN SECTION N/A 02/02/2015   Procedure: CESAREAN SECTION;  Surgeon: Boykin Nearing, MD;  Location: [facility] OR[facility];  Service: Obstetrics;  Laterality: N/A;   Family History  Problem Relation Age of Onset   Hyperlipidemia Father    Hypertension Father    Mental illness Father    Diabetes Father    Heart disease Father        stent    Mental illness Sister     Mental illness Brother    Breast cancer Maternal Grandmother    Lung cancer Maternal Grandmother    Breast cancer Paternal Grandmother    Mental illness Sister    Hyperlipidemia Mother    Social History   Socioeconomic History   Marital status: Married    Spouse name: Not on file   Number of children: Not on file   Years of education: Not on file   Highest education level: Not on file  Occupational History   Not on file  Tobacco Use   Smoking status: Every Day    Packs/day: 0.50    Years: 15.00    Pack years: 7.50    Types: Cigarettes   Smokeless tobacco: Never  Vaping Use   Vaping Use: Never used  Substance and Sexual Activity   Alcohol use: Yes    Comment: 2 BEERS DAILY   Drug use: No   Sexual activity: Yes    Partners: Male    Birth control/protection: I.U.D.  Other Topics Concern   Not on file  Social History Narrative   Not on file   Social Determinants of Health   Financial Resource Strain: Not on file  Food Insecurity: Not on file  Transportation Needs: Not on file  Physical Activity: Not on file  Stress: Not on file  Social Connections:  Not on file     Review of Systems  Constitutional:  Negative for appetite change and unexpected weight change.  HENT:  Negative for sinus pressure.        Some minimal congestion/drainage.   Respiratory:  Negative for cough, chest tightness and shortness of breath.   Cardiovascular:  Positive for palpitations. Negative for chest pain and leg swelling.  Gastrointestinal:  Negative for abdominal pain, diarrhea, nausea and vomiting.  Genitourinary:  Negative for difficulty urinating and dysuria.  Musculoskeletal:  Negative for joint swelling and myalgias.  Skin:  Negative for color change and rash.  Neurological:  Positive for light-headedness and headaches.  Psychiatric/Behavioral:  Negative for agitation and dysphoric mood.       Objective:     BP 118/78    Pulse 78    Temp 98 F (36.7 C) (Oral)    Resp 17     Ht $R'5\' 6"'Ba$  (1.676 m)    Wt 160 lb (72.6 kg)    SpO2 98%    BMI 25.82 kg/m  Wt Readings from Last 3 Encounters:  04/15/21 160 lb (72.6 kg)  07/02/20 148 lb (67.1 kg)  06/11/20 147 lb (66.7 kg)    Physical Exam Vitals reviewed.  Constitutional:      General: She is not in acute distress.    Appearance: Normal appearance.  HENT:     Head: Normocephalic and atraumatic.     Right Ear: External ear normal.     Left Ear: External ear normal.  Eyes:     General: No scleral icterus.       Right eye: No discharge.        Left eye: No discharge.     Conjunctiva/sclera: Conjunctivae normal.  Neck:     Thyroid: No thyromegaly.  Cardiovascular:     Rate and Rhythm: Normal rate and regular rhythm.  Pulmonary:     Effort: No respiratory distress.     Breath sounds: Normal breath sounds. No wheezing.  Abdominal:     General: Bowel sounds are normal.     Palpations: Abdomen is soft.     Tenderness: There is no abdominal tenderness.  Musculoskeletal:        General: No swelling or tenderness.     Cervical back: Neck supple. No tenderness.  Lymphadenopathy:     Cervical: No cervical adenopathy.  Skin:    Findings: No erythema or rash.  Neurological:     Mental Status: She is alert.  Psychiatric:        Mood and Affect: Mood normal.        Behavior: Behavior normal.     Outpatient Encounter Medications as of 04/15/2021  Medication Sig   alum & mag hydroxide-simeth (MAALOX/MYLANTA) 200-200-20 MG/5ML suspension Take 20 mLs by mouth as needed for indigestion or heartburn.   fluticasone (FLONASE) 50 MCG/ACT nasal spray Place 1-2 sprays into both nostrils daily as needed for allergies or rhinitis.   loratadine (CLARITIN) 10 MG tablet Take 10 mg by mouth daily as needed for allergies.   magnesium oxide (MAG-OX) 400 (240 Mg) MG tablet Take 1 tablet (400 mg total) by mouth daily.   omeprazole (PRILOSEC) 20 MG capsule TAKE 2 CAPSULES (40 MG) BY MOUTH AS NEEDED.   PARAGARD INTRAUTERINE COPPER  IUD IUD 1 each by Intrauterine route once.   sertraline (ZOLOFT) 25 MG tablet TAKE 1 & 1/2 TABLETS (37.5 MG TOTAL) BY MOUTH ONCE DAILY   triamcinolone (KENALOG) 0.1 % APPLY TO AFFECTED AREAS  TWO TIMES DAILY AS NEEDED. NOT FOR FACE.   [DISCONTINUED] albuterol (PROVENTIL) (2.5 MG/3ML) 0.083% nebulizer solution Take 3 mLs (2.5 mg total) by nebulization every 6 (six) hours as needed for wheezing or shortness of breath.   omeprazole (PRILOSEC) 20 MG capsule TAKE 2 CAPSULES (40 MG) BY MOUTH ONCE DAILY   sertraline (ZOLOFT) 25 MG tablet TAKE 1 & 1/2 TABLETS (37.5 MG TOTAL) BY MOUTH ONCE DAILY   [DISCONTINUED] benzonatate (TESSALON) 200 MG capsule Take 1 capsule (200 mg total) by mouth 3 (three) times daily as needed for cough. (Patient not taking: Reported on 04/15/2021)   [DISCONTINUED] dicyclomine (BENTYL) 10 MG capsule Take 1 capsule (10 mg total) by mouth 4 (four) times daily -  before meals and at bedtime. (Patient not taking: Reported on 04/28/2020)   [DISCONTINUED] doxycycline (VIBRA-TABS) 100 MG tablet TAKE 1 TABLET (100 MG TOTAL) BY MOUTH 2 (TWO) TIMES DAILY. TAKE WITH FOOD .   [DISCONTINUED] hydrOXYzine (VISTARIL) 25 MG capsule TAKE 1 TO 2 CAPSULES (25-50 MG TOTAL) BY MOUTH EVERY 8 (EIGHT) HOURS AS NEEDED. TRY AT NIGHT 1 TO 2 CAPSULES FIRST. (Patient not taking: Reported on 04/15/2021)   [DISCONTINUED] ibuprofen (ADVIL) 600 MG tablet TAKE 1 TABLET BY MOUTH EVERY 8 (EIGHT) HOURS AS NEEDED FOR MILD PAIN OR MODERATE PAIN   [DISCONTINUED] mupirocin ointment (BACTROBAN) 2 % APPLY TO THE AFFECTED AREA(S) OF OPEN WOUNDS ON ABDOMEN THREE TIMES DAILY (Patient not taking: Reported on 04/15/2021)   [DISCONTINUED] ondansetron (ZOFRAN) 4 MG tablet TAKE 1 TABLET (4 MG TOTAL) BY MOUTH EVERY 8 (EIGHT) HOURS AS NEEDED. (Patient not taking: Reported on 04/15/2021)   [DISCONTINUED] predniSONE (DELTASONE) 10 MG tablet 6 tablets daily for 3 days, then reduce by 1 tablet daily until gone (Patient not taking: Reported on  04/15/2021)   [DISCONTINUED] sertraline (ZOLOFT) 25 MG tablet Take 1 tablet (25 mg total) by mouth daily. (Patient not taking: Reported on 04/15/2021)   No facility-administered encounter medications on file as of 04/15/2021.     Lab Results  Component Value Date   WBC 8.9 04/15/2021   HGB 11.8 (L) 04/15/2021   HCT 36.6 04/15/2021   PLT 372.0 04/15/2021   GLUCOSE 95 04/15/2021   ALT 15 12/02/2020   AST 16 12/02/2020   NA 136 04/15/2021   K 4.1 04/15/2021   CL 103 04/15/2021   CREATININE 0.77 04/15/2021   BUN 11 04/15/2021   CO2 29 04/15/2021   TSH 1.62 04/15/2021    No results found.     Assessment & Plan:   Problem List Items Addressed This Visit     Headache    Persistent headache as outlined.  No severe.  No significant sinus pressure.  History of occular migraines.  Stay hydrated.  Labs as outlined.  Follow.  Start the mag oxide as outlined.  If persistent symptoms discussed the need for further w/up and evaluation.        Light headedness    Associated with palpitations as outlined.  No recent illness.  Eating.  No nausea or vomiting.  Plan w/up as outlined.        Palpitations - Primary    Intermittent episodes of palpitations and associated light headedness - over the past several days.  No known triggers. No increased stress. She did start back on zoloft.  Increased acid reflux.  Has restarted prilosec.  Instructed to continue as outlined.  EKG - SR with no acute ischemic changes.  Exam unrevealing.  Family history of earlier  heart disease.  Continue zoloft and prilosec as outlined.  Check cbc, met b, tsh and magnesium.  Will start mag oxide.  Discussed f/u with cardiology for question of repeat monitor - given increased frequency.  Reviewed previous monitor results. Worse zio monitor - three brief episodes of SVT noted.  Previous ECHO unrevealing.  Discuss with PCP regarding follow up.       Relevant Orders   CBC w/Diff (Completed)   Basic Metabolic Panel (BMET)  (Completed)   TSH (Completed)   Magnesium (Completed)   EKG 12-Lead     Einar Pheasant, MD

## 2021-04-16 ENCOUNTER — Other Ambulatory Visit: Payer: Self-pay

## 2021-04-16 ENCOUNTER — Other Ambulatory Visit: Payer: Self-pay | Admitting: Internal Medicine

## 2021-04-16 ENCOUNTER — Encounter: Payer: Self-pay | Admitting: Internal Medicine

## 2021-04-16 DIAGNOSIS — I471 Supraventricular tachycardia, unspecified: Secondary | ICD-10-CM

## 2021-04-16 DIAGNOSIS — R519 Headache, unspecified: Secondary | ICD-10-CM | POA: Insufficient documentation

## 2021-04-16 DIAGNOSIS — R42 Dizziness and giddiness: Secondary | ICD-10-CM | POA: Insufficient documentation

## 2021-04-16 HISTORY — DX: Supraventricular tachycardia, unspecified: I47.10

## 2021-04-16 MED ORDER — METOPROLOL TARTRATE 25 MG PO TABS
12.5000 mg | ORAL_TABLET | Freq: Two times a day (BID) | ORAL | 0 refills | Status: DC
  Filled 2021-04-16: qty 90, 90d supply, fill #0

## 2021-04-16 NOTE — Assessment & Plan Note (Signed)
Associated with palpitations as outlined.  No recent illness.  Eating.  No nausea or vomiting.  Plan w/up as outlined.

## 2021-04-16 NOTE — Assessment & Plan Note (Signed)
With light headedness and presyncope. Starting metoprolol tartrate 12.5 mg bid.  [facility]

## 2021-04-16 NOTE — Assessment & Plan Note (Signed)
Persistent headache as outlined.  No severe.  No significant sinus pressure.  History of occular migraines.  Stay hydrated.  Labs as outlined.  Follow.  Start the mag oxide as outlined.  If persistent symptoms discussed the need for further w/up and evaluation.

## 2021-04-16 NOTE — Assessment & Plan Note (Signed)
Intermittent episodes of palpitations and associated light headedness - over the past several days.  No known triggers. No increased stress. She did start back on zoloft.  Increased acid reflux.  Has restarted prilosec.  Instructed to continue as outlined.  EKG - SR with no acute ischemic changes.  Exam unrevealing.  Family history of earlier heart disease.  Continue zoloft and prilosec as outlined.  Check cbc, met b, tsh and magnesium.  Will start mag oxide.  Discussed f/u with cardiology for question of repeat monitor - given increased frequency.  Reviewed previous monitor results. Worse zio monitor - three brief episodes of SVT noted.  Previous ECHO unrevealing.  Discuss with PCP regarding follow up.

## 2021-04-23 ENCOUNTER — Other Ambulatory Visit: Payer: Self-pay

## 2021-04-23 MED FILL — Omeprazole Cap Delayed Release 20 MG: ORAL | 90 days supply | Qty: 180 | Fill #0 | Status: AC

## 2021-04-30 NOTE — Progress Notes (Deleted)
Cardiology Office Note:    Date:  04/30/2021   ID:  Hannah Snow, DOB 1985-11-13, MRN [id number]  PCP:  Hannah Shams, MD  [facility] Cardiologist:  None  [facility] Electrophysiologist:  None   Referring MD: Hannah Shams, MD   Chief Complaint: lightheadedness  History of Present Illness:    Hannah Snow is a 36 y.o. female with a hx of palpitations and borderline abnormal EKG, tobacco use, family history of premature CAD who presents for lightheadedness.   Seen by Dr. Kirke Snow in 2020 for palpitations and rapid heart rate. Holter monitor in the past that showed no significant arrhythmia, but this was only 24 hours. A 2 week monitor was ordered. EKG showed short PR interval no delta wave. Previous atypical chest pain with stress test was normal.   Today,    Past Medical History:  Diagnosis Date   Anemia    Anxiety    Depression    GERD (gastroesophageal reflux disease)    History of kidney stones    H/O    Past Surgical History:  Procedure Laterality Date   CESAREAN SECTION     CESAREAN SECTION N/A 02/02/2015   Procedure: CESAREAN SECTION;  Surgeon: Suzy Bouchard, MD;  Location: [facility] ORS;  Service: Obstetrics;  Laterality: N/A;    Current Medications: No outpatient medications have been marked as taking for the 05/04/21 encounter (Appointment) with Fransico Michael, Eliseo Withers H, PA-C.     Allergies:   Tylenol [acetaminophen], Latex, and Nickel   Social History   Socioeconomic History   Marital status: Married    Spouse name: Not on file   Number of children: Not on file   Years of education: Not on file   Highest education level: Not on file  Occupational History   Not on file  Tobacco Use   Smoking status: Every Day    Packs/day: 0.50    Years: 15.00    Pack years: 7.50    Types: Cigarettes   Smokeless tobacco: Never  Vaping Use   Vaping Use: Never used  Substance and Sexual Activity   Alcohol use: Yes    Comment: 2 BEERS DAILY   Drug  use: No   Sexual activity: Yes    Partners: Male    Birth control/protection: I.U.D.  Other Topics Concern   Not on file  Social History Narrative   Not on file   Social Determinants of Health   Financial Resource Strain: Not on file  Food Insecurity: Not on file  Transportation Needs: Not on file  Physical Activity: Not on file  Stress: Not on file  Social Connections: Not on file     Family History: The patient's family history includes Breast cancer in her maternal grandmother and paternal grandmother; Diabetes in her father; Heart disease in her father; Hyperlipidemia in her father and mother; Hypertension in her father; Lung cancer in her maternal grandmother; Mental illness in her brother, father, sister, and sister.  ROS:   Please see the history of present illness.     All other systems reviewed and are negative.  EKGs/Labs/Other Studies Reviewed:    The following studies were reviewed today:  Heart monitor 2020 14-day monitor:   Normal sinus rhythm with an average heart rate of 88 bpm. 3 episodes of supraventricular tachycardia noted.  The longest lasted 10 beats. Rare PACs and PVCs. Most of the patient's triggered events did not correlate with arrhythmia.  EKG:  EKG is *** ordered today.  The ekg ordered today demonstrates ***  Recent Labs: 12/02/2020: ALT 15 04/15/2021: BUN 11; Creatinine, Ser 0.77; Hemoglobin 11.8; Magnesium 2.0; Platelets 372.0; Potassium 4.1; Sodium 136; TSH 1.62  Recent Lipid Panel No results found for: CHOL, TRIG, HDL, CHOLHDL, VLDL, LDLCALC, LDLDIRECT   Risk Assessment/Calculations:   {Does this patient have ATRIAL FIBRILLATION?:[phone]}   Physical Exam:    VS:  There were no vitals taken for this visit.    Wt Readings from Last 3 Encounters:  04/15/21 160 lb (72.6 kg)  07/02/20 148 lb (67.1 kg)  06/11/20 147 lb (66.7 kg)     GEN: *** Well nourished, well developed in no acute distress HEENT: Normal NECK: No JVD; No  carotid bruits LYMPHATICS: No lymphadenopathy CARDIAC: ***RRR, no murmurs, rubs, gallops RESPIRATORY:  Clear to auscultation without rales, wheezing or rhonchi  ABDOMEN: Soft, non-tender, non-distended MUSCULOSKELETAL:  No edema; No deformity  SKIN: Warm and dry NEUROLOGIC:  Alert and oriented x 3 PSYCHIATRIC:  Normal affect   ASSESSMENT:    No diagnosis found. PLAN:    In order of problems listed above:  Lightheadedness    Disposition: Follow up {follow up:15908} with ***   Shared Decision Making/Informed Consent   {Are you ordering a CV Procedure (e.g. stress test, cath, DCCV, TEE, etc)?   Press F2        :[id number]}    Signed, Addam Goeller David Stall, PA-C  04/30/2021 2:46 PM    [facility]

## 2021-05-04 ENCOUNTER — Ambulatory Visit: Payer: No Typology Code available for payment source | Admitting: Medical

## 2021-05-04 ENCOUNTER — Telehealth: Payer: Self-pay | Admitting: Internal Medicine

## 2021-05-04 NOTE — Telephone Encounter (Signed)
-----   Message from Wellington Hampshire, MD sent at 04/26/2021  5:38 PM EST ----- Regarding: RE: question and follow up Hi, Her previous work-up has been reassuring overall.  When she had the 2-week monitor, she only had short runs of SVT and nothing serious.  At that time, most of her palpitation symptoms did not correlate with significant arrhythmia.  If her symptoms now do not improve, I am happy to see her again and repeat the work-up if needed. Thanks   MA  ----- Message ----- From: Einar Pheasant, MD Sent: 04/22/2021   8:33 AM EST To: Wellington Hampshire, MD Subject: question and follow up                         Sussie is Dr Lupita Dawn CMA and her patient.  Dr Derrel Nip was out of the office and I was asked to see Kynzlie - due to persistent intermittent palpitations associated with light headedness.  EKG ok.  Labs ok.  She did start back on zoloft for some increased anxiety.  I had her start back on prilosec to treat her acid reflux.  I also placed her on mag oxide.  In reviewing her chart,  you had seen her previously.  Previous monitor results - zio monitor - three brief episodes of SVT noted.  Previous ECHO unrevealing.  Her symptoms have improved (still present but better). I wanted to get your input regarding further w/up and evaluation.  She is ok to schedule a f/u appt.  Of note, she does have a strong family history of early heart disease (father in 24s).  (No sudden death that she is aware of).  Dr Derrel Nip started her on metoprol, but she did not tolerate this medication.  Just wanted to get your thoughts and I do appreciate your help.    Thank you  Randell Patient

## 2021-05-10 ENCOUNTER — Ambulatory Visit: Payer: No Typology Code available for payment source | Admitting: Internal Medicine

## 2021-05-12 ENCOUNTER — Ambulatory Visit: Payer: No Typology Code available for payment source | Admitting: Medical

## 2021-07-12 ENCOUNTER — Other Ambulatory Visit: Payer: Self-pay

## 2021-07-12 MED ORDER — OMEPRAZOLE 20 MG PO CPDR
DELAYED_RELEASE_CAPSULE | ORAL | 3 refills | Status: DC
  Filled 2021-07-12: qty 180, 90d supply, fill #0
  Filled 2021-10-19: qty 180, 90d supply, fill #1

## 2021-07-12 NOTE — Telephone Encounter (Signed)
Medication refilled

## 2021-07-14 ENCOUNTER — Other Ambulatory Visit: Payer: Self-pay

## 2021-07-14 MED FILL — Sertraline HCl Tab 25 MG: ORAL | 90 days supply | Qty: 135 | Fill #1 | Status: AC

## 2021-07-29 ENCOUNTER — Ambulatory Visit (INDEPENDENT_AMBULATORY_CARE_PROVIDER_SITE_OTHER): Payer: No Typology Code available for payment source | Admitting: Medical

## 2021-07-29 ENCOUNTER — Ambulatory Visit: Payer: Self-pay

## 2021-07-29 ENCOUNTER — Encounter: Payer: Self-pay | Admitting: Medical

## 2021-07-29 ENCOUNTER — Other Ambulatory Visit: Payer: Self-pay

## 2021-07-29 VITALS — BP 123/79 | HR 76 | Ht 66.0 in | Wt 159.0 lb

## 2021-07-29 DIAGNOSIS — R42 Dizziness and giddiness: Secondary | ICD-10-CM

## 2021-07-29 DIAGNOSIS — Z789 Other specified health status: Secondary | ICD-10-CM

## 2021-07-29 DIAGNOSIS — R002 Palpitations: Secondary | ICD-10-CM

## 2021-07-29 DIAGNOSIS — Z720 Tobacco use: Secondary | ICD-10-CM

## 2021-07-29 DIAGNOSIS — I471 Supraventricular tachycardia, unspecified: Secondary | ICD-10-CM

## 2021-07-29 MED ORDER — DILTIAZEM HCL 30 MG PO TABS
30.0000 mg | ORAL_TABLET | Freq: Every day | ORAL | 0 refills | Status: DC
  Filled 2021-07-29: qty 30, 30d supply, fill #0

## 2021-07-29 NOTE — Patient Instructions (Signed)
Medication Instructions:  ? ?Your physician has recommended you make the following change in your medication:  ? ? Take Diltiazem 30 MG once a day for 30 days. ? ?*If you need a refill on your cardiac medications before your next appointment, please call your pharmacy* ? ? ?Lab Work: ?None ordered ?If you have labs (blood work) drawn today and your tests are completely normal, you will receive your results only by: ?MyChart Message (if you have MyChart) OR ?A paper copy in the mail ?If you have any lab test that is abnormal or we need to change your treatment, we will call you to review the results. ? ? ?Testing/Procedures: ? ?Your physician has recommended that you wear a Zio XT monitor for 2 week. This will be mailed to your home address in 4-5 business days.  ? ?This monitor is a medical device that records the heart?s electrical activity. Doctors most often use these monitors to diagnose arrhythmias. Arrhythmias are problems with the speed or rhythm of the heartbeat. The monitor is a small device applied to your chest. You can wear one while you do your normal daily activities. While wearing this monitor if you have any symptoms to push the button and record what you felt. Once you have worn this monitor for the period of time provider prescribed (Usually 14 days), you will return the monitor device in the postage paid box. Once it is returned they will download the data collected and provide [place] with a report which the provider will then review and we will call you with those results. Important tips: ? ?Avoid showering during the first 24 hours of wearing the monitor. ?Avoid excessive sweating to help maximize wear time. ?Do not submerge the device, no hot tubs, and no swimming pools. ?Keep any lotions or oils away from the patch. ?After 24 hours you may shower with the patch on. Take brief showers with your back facing the shower head.  ?Do not remove patch once it has been placed because that will interrupt  data and decrease adhesive wear time. ?Push the button when you have any symptoms and write down what you were feeling. ?Once you have completed wearing your monitor, remove and place into box which has postage paid and place in your outgoing mailbox.  ?If for some reason you have misplaced your box then call our office and we can provide another box and/or mail it off for you. ? ? ? ? ? ?Follow-Up: ?At [facility], you and your health needs are our priority.  As part of our continuing mission to provide you with exceptional heart care, we have created designated Provider Care Teams.  These Care Teams include your primary Cardiologist (physician) and Advanced Practice Providers (APPs -  Physician Assistants and Nurse Practitioners) who all work together to provide you with the care you need, when you need it. ? ?We recommend signing up for the patient portal called "MyChart".  Sign up information is provided on this After Visit Summary.  MyChart is used to connect with patients for Virtual Visits (Telemedicine).  Patients are able to view lab/test results, encounter notes, upcoming appointments, etc.  Non-urgent messages can be sent to your provider as well.   ?To learn more about what you can do with MyChart, go to [email or web address].   ? ?Your next appointment:   ?8 week(s) ? ?The format for your next appointment:   ?In Person ? ?Provider:   ?You may see Dr. Kirke Corin or one of the  following Advanced Practice Providers on your designated Care Team:   ?Christopher Berge, NP ?Ryan Dunn, PA-C ?Cadence Furth, PA-C  ? ? ?Other Instructions ? ? ?Important Information About Sugar ? ? ? ? ? ? ?

## 2021-07-29 NOTE — Progress Notes (Signed)
?Cardiology Office Note:   ? ?Date:  07/29/2021  ? ?ID:  Hannah Snow, DOB 01-14-1986, MRN [id number] ? ?PCP:  Sherlene Shams, MD  ?[facility] Cardiologist:  None  ?[facility] Electrophysiologist:  None  ? ?Referring MD: Sherlene Shams, MD  ? ?Chief Complaint: dizziness and palpitations ? ?History of Present Illness:   ? ?Hannah Snow is a 36 y.o. female with a hx of palpitations, GAD, GERD, tobacco use who presents for follow-up.  ? ?Family history of premature CAD.  ? ?Saw KC in 2018/2019 for chest pain. Ddimer wnl. Stress echo was negative for ischemia. She was able to exercise fo 9 minutes with mild chest tightness. Echo showed normal LVSF with no significant valve abnormalities. Holter monitor showed SR with no significant arrhythmia.  ? ?Last seen 05/31/18 for palpitations. A longer heart monitor was ordered. This showed NSR with average HR of 88bpm, 3 episodes of SVT, rare PACs/PVCs. ? ?Today, the patient reports intermittent dizziness and palpitations. She has infrequent episodes. Most recently she had an episode in January 2023 with heart fluttering followed by dizziness. This only lasted a couple seconds. She did not pass out. No Chest pain or SOB. At the time, Her PCP did labs (which were unremarkable) and started her on metoprolol 25mg . Palpitations worse during menstrual cycle. NO LLE, orthopnea, pnd. Patient drinks 2-3 cups coffee in the AM. Drinks soda once daily. Patient drinks 2 beers a day. Smokes 1/2 ppd. 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 Diet is bad, eating out a lot.  ? ? ?Past Medical History:  ?Diagnosis Date  ? Anemia   ? Anxiety   ? Depression   ? GERD (gastroesophageal reflux disease)   ? History of kidney stones   ? H/O  ? ? ?Past Surgical History:  ?Procedure Laterality Date  ? CESAREAN SECTION    ? CESAREAN SECTION N/A 02/02/2015  ? Procedure: CESAREAN SECTION;  Surgeon: 02/04/2015, MD;  Location: [facility] ORS;  Service:  Obstetrics;  Laterality: N/A;  ? ? ?Current Medications: ?Current Meds  ?Medication Sig  ? alum & mag hydroxide-simeth (MAALOX/MYLANTA) 200-200-20 MG/5ML suspension Take 20 mLs by mouth as needed for indigestion or heartburn.  ? dicyclomine (BENTYL) 10 MG capsule Take 10 mg by mouth 3 (three) times daily as needed for spasms.  ? diltiazem (CARDIZEM) 30 MG tablet Take 1 tablet (30 mg total) by mouth daily.  ? fluticasone (FLONASE) 50 MCG/ACT nasal spray Place 1-2 sprays into both nostrils daily as needed for allergies or rhinitis.  ? loratadine (CLARITIN) 10 MG tablet Take 10 mg by mouth daily as needed for allergies.  ? omeprazole (PRILOSEC) 20 MG capsule TAKE 2 CAPSULES (40 MG) BY MOUTH AS NEEDED.  ? PARAGARD INTRAUTERINE COPPER IUD IUD 1 each by Intrauterine route once.  ? sertraline (ZOLOFT) 25 MG tablet TAKE 1 & 1/2 TABLETS (37.5 MG TOTAL) BY MOUTH ONCE DAILY  ?  ? ?Allergies:   Tylenol [acetaminophen], Latex, and Nickel  ? ?Social History  ? ?Socioeconomic History  ? Marital status: Married  ?  Spouse name: Not on file  ? Number of children: Not on file  ? Years of education: Not on file  ? Highest education level: Not on file  ?Occupational History  ? Not on file  ?Tobacco Use  ? Smoking status: Every Day  ?  Packs/day: 0.50  ?  Years: 15.00  ?  Pack years: 7.50  ?  Types: Cigarettes  ? Smokeless tobacco: Never  ?Vaping Use  ? Vaping Use: Never used  ?Substance and Sexual Activity  ? Alcohol use: Yes  ?  Comment: 2 BEERS DAILY  ? Drug use: No  ? Sexual activity: Yes  ?  Partners: Male  ?  Birth control/protection: I.U.D.  ?Other Topics Concern  ? Not on file  ?Social History Narrative  ? Not on file  ? ?Social Determinants of Health  ? ?Financial Resource Strain: Not on file  ?Food Insecurity: Not on file  ?Transportation Needs: Not on file  ?Physical Activity: Not on file  ?Stress: Not on file  ?Social Connections: Not on file  ?  ? ?Family History: ?The patient's family history includes Breast cancer in her  maternal grandmother and paternal grandmother; Diabetes in her father; Heart disease in her father; Hyperlipidemia in her father and mother; Hypertension in her father; Lung cancer in her maternal grandmother; Mental illness in her brother, father, sister, and sister. ? ?ROS:   ?Please see the history of present illness.    ? All other systems reviewed and are negative. ? ?EKGs/Labs/Other Studies Reviewed:   ? ?The following studies were reviewed today: ? ?Heart monitor 05/2018 ?  ?Normal sinus rhythm with an average heart rate of 88 bpm. ?3 episodes of supraventricular tachycardia noted.  The longest lasted 10 beats. ?Rare PACs and PVCs. ?Most of the patient's triggered events did not correlate with arrhythmia. ? ?EKG:  EKG is ordered today.  The ekg ordered today demonstrates NSR, 76bpm, PRI , nonspecific T wave changes ? ?Recent Labs: ?12/02/2020: ALT 15 ?04/15/2021: BUN 11; Creatinine, Ser 0.77; Hemoglobin 11.8; Magnesium 2.0; Platelets 372.0; Potassium 4.1; Sodium 136; TSH 1.62  ?Recent Lipid Panel ?No results found for: CHOL, TRIG, HDL, CHOLHDL, VLDL, LDLCALC, LDLDIRECT ? ? ?Physical Exam:   ? ?VS:  BP 123/79 (BP Location: Left Arm, Patient Position: Sitting, Cuff Size: Normal)   Pulse 76   Ht 5\' 6"  (1.676 m)   Wt 159 lb (72.1 kg)   SpO2 98%   BMI 25.66 kg/m?    ? ?Wt Readings from Last 3 Encounters:  ?07/29/21 159 lb (72.1 kg)  ?04/15/21 160 lb (72.6 kg)  ?07/02/20 148 lb (67.1 kg)  ?  ? ?GEN:  Well nourished, well developed in no acute distress ?HEENT: Normal ?NECK: No JVD; No carotid bruits ?LYMPHATICS: No lymphadenopathy ?CARDIAC: RRR, no murmurs, rubs, gallops ?RESPIRATORY:  Clear to auscultation without rales, wheezing or rhonchi  ?ABDOMEN: Soft, non-tender, non-distended ?MUSCULOSKELETAL:  No edema; No deformity  ?SKIN: Warm and dry ?NEUROLOGIC:  Alert and oriented x 3 ?PSYCHIATRIC:  Normal affect  ? ?ASSESSMENT:   ? ?1. PSVT (paroxysmal supraventricular tachycardia) (HCC)   ?2. Palpitations    ?3. Dizziness   ?4. Tobacco use   ?5. Alcohol use   ? ?PLAN:   ? ?In order of problems listed above: ? ?Palpitations/dizziness ?H/o pSVT ?Patient presents with episodes of palpitations followed by dizziness. Last episode was in January, lasted a couple seconds. NO LOC, CP, SOB. At that time PCP did labs which were unremarkable and she was started metoprolol, which the patient did not tolerate. The patient denies further episodes since the one in January. She has a h/o pSVT on heart monitor in 2020. Palpitations generally worse around menstrual cycle. EKG today shows SR HR 76. Orthostatics were negative. Suspect she had an episode of SVT. I will re-check 2 week heart monitor. Patient does  not feel daily medication is necessary. I will give 30 days of short acting diltiazem 30mg . We also heavily discussed lifestyle, encouraged tobacco cessation, alcohol decrease, and diet/exercise.  ? ?Tobacco use ?She smokes 1/2 ppd. Complete cessation advised. ? ?Alcohol use ?She drinks 2 beers daily. Cessation or reduction advised.  ? ?Disposition: Follow up in 6-8 week(s) with MD/APP  ? ? ? ?Signed, ?Salley Boxley David StallH Kobyn Kray, PA-C  ?07/29/2021 3:56 PM    ?[facility]  ?

## 2021-08-10 ENCOUNTER — Other Ambulatory Visit: Payer: Self-pay

## 2021-08-31 ENCOUNTER — Other Ambulatory Visit: Payer: Self-pay | Admitting: Internal Medicine

## 2021-08-31 DIAGNOSIS — J029 Acute pharyngitis, unspecified: Secondary | ICD-10-CM

## 2021-08-31 DIAGNOSIS — Z20822 Contact with and (suspected) exposure to covid-19: Secondary | ICD-10-CM

## 2021-09-01 ENCOUNTER — Other Ambulatory Visit: Payer: Self-pay | Admitting: Internal Medicine

## 2021-09-01 MED ORDER — AMOXICILLIN-POT CLAVULANATE 875-125 MG PO TABS: 1.0000 | ORAL_TABLET | Freq: Two times a day (BID) | ORAL | 0 refills | Status: DC

## 2021-09-01 MED ORDER — PREDNISONE 10 MG PO TABS: ORAL_TABLET | ORAL | 0 refills | Status: DC

## 2021-09-23 ENCOUNTER — Ambulatory Visit: Payer: No Typology Code available for payment source | Admitting: Medical

## 2021-09-23 ENCOUNTER — Encounter: Payer: Self-pay | Admitting: Medical

## 2021-09-23 NOTE — Progress Notes (Deleted)
Cardiology Office Note:    Date:  09/23/2021   ID:  Hannah Snow, DOB 28-Dec-1985, MRN [id number]  PCP:  Sherlene Shams, MD  [facility] Cardiologist:  None  [facility] Electrophysiologist:  None   Referring MD: Sherlene Shams, MD   Chief Complaint: 8 week follow-up  History of Present Illness:    Hannah Snow is a 36 y.o. female with a hx of palpitations, GAD, GERD, tobacco use who presents for follow-up.     Family history of premature CAD.    Saw KC in 2018/2019 for chest pain. Ddimer wnl. Stress echo was negative for ischemia. She was able to exercise fo 9 minutes with mild chest tightness. Echo showed normal LVSF with no significant valve abnormalities. Holter monitor showed SR with no significant arrhythmia.    Last seen 05/31/18 for palpitations. A longer heart monitor was ordered. This showed NSR with average HR of 88bpm, 3 episodes of SVT, rare PACs/PVCs.   Last seen 07/2021 and reported intermittent dizziness and palpitations. She was given short acting dilt to take as needed. Heart monitor was ordered. Recommended alcohol cessation.   Heart monitor     Past Medical History:  Diagnosis Date   Anemia    Anxiety    Depression    GERD (gastroesophageal reflux disease)    History of kidney stones    H/O    Past Surgical History:  Procedure Laterality Date   CESAREAN SECTION     CESAREAN SECTION N/A 02/02/2015   Procedure: CESAREAN SECTION;  Surgeon: Suzy Bouchard, MD;  Location: [facility] ORS;  Service: Obstetrics;  Laterality: N/A;    Current Medications: No outpatient medications have been marked as taking for the 09/23/21 encounter (Appointment) with Fransico Michael, Tesia Lybrand H, PA-C.     Allergies:   Tylenol [acetaminophen], Latex, and Nickel   Social History   Socioeconomic History   Marital status: Married    Spouse name: Not on file   Number of children: Not on file   Years of education: Not on file   Highest education level: Not on  file  Occupational History   Not on file  Tobacco Use   Smoking status: Every Day    Packs/day: 0.50    Years: 15.00    Total pack years: 7.50    Types: Cigarettes   Smokeless tobacco: Never  Vaping Use   Vaping Use: Never used  Substance and Sexual Activity   Alcohol use: Yes    Comment: 2 BEERS DAILY   Drug use: No   Sexual activity: Yes    Partners: Male    Birth control/protection: I.U.D.  Other Topics Concern   Not on file  Social History Narrative   Not on file   Social Determinants of Health   Financial Resource Strain: Not on file  Food Insecurity: Not on file  Transportation Needs: Not on file  Physical Activity: Not on file  Stress: Not on file  Social Connections: Not on file     Family History: The patient's ***family history includes Breast cancer in her maternal grandmother and paternal grandmother; Diabetes in her father; Heart disease in her father; Hyperlipidemia in her father and mother; Hypertension in her father; Lung cancer in her maternal grandmother; Mental illness in her brother, father, sister, and sister.  ROS:   Please see the history of present illness.    *** All other systems reviewed and are negative.  EKGs/Labs/Other Studies Reviewed:    The following studies  were reviewed today: ***  EKG:  EKG is *** ordered today.  The ekg ordered today demonstrates ***  Recent Labs: 12/02/2020: ALT 15 04/15/2021: BUN 11; Creatinine, Ser 0.77; Hemoglobin 11.8; Magnesium 2.0; Platelets 372.0; Potassium 4.1; Sodium 136; TSH 1.62  Recent Lipid Panel No results found for: "CHOL", "TRIG", "HDL", "CHOLHDL", "VLDL", "LDLCALC", "LDLDIRECT"   Risk Assessment/Calculations:   {Does this patient have ATRIAL FIBRILLATION?:[phone]}   Physical Exam:    VS:  There were no vitals taken for this visit.    Wt Readings from Last 3 Encounters:  07/29/21 159 lb (72.1 kg)  04/15/21 160 lb (72.6 kg)  07/02/20 148 lb (67.1 kg)     GEN: *** Well nourished,  well developed in no acute distress HEENT: Normal NECK: No JVD; No carotid bruits LYMPHATICS: No lymphadenopathy CARDIAC: ***RRR, no murmurs, rubs, gallops RESPIRATORY:  Clear to auscultation without rales, wheezing or rhonchi  ABDOMEN: Soft, non-tender, non-distended MUSCULOSKELETAL:  No edema; No deformity  SKIN: Warm and dry NEUROLOGIC:  Alert and oriented x 3 PSYCHIATRIC:  Normal affect   ASSESSMENT:    No diagnosis found. PLAN:    In order of problems listed above:  ***  Disposition: Follow up {follow up:15908} with ***   Shared Decision Making/Informed Consent   {Are you ordering a CV Procedure (e.g. stress test, cath, DCCV, TEE, etc)?   Press F2        :[id number]}    Signed, Yasmina Chico David Stall, PA-C  09/23/2021 1:20 PM    [facility]

## 2021-10-19 ENCOUNTER — Other Ambulatory Visit: Payer: Self-pay

## 2021-11-10 ENCOUNTER — Other Ambulatory Visit: Payer: Self-pay | Admitting: Internal Medicine

## 2021-11-10 ENCOUNTER — Other Ambulatory Visit (INDEPENDENT_AMBULATORY_CARE_PROVIDER_SITE_OTHER): Payer: No Typology Code available for payment source

## 2021-11-10 DIAGNOSIS — N3090 Cystitis, unspecified without hematuria: Secondary | ICD-10-CM

## 2021-11-10 DIAGNOSIS — M7989 Other specified soft tissue disorders: Secondary | ICD-10-CM | POA: Insufficient documentation

## 2021-11-10 LAB — D-DIMER, QUANTITATIVE: D-DIMER: 0.2 mg/L FEU (ref 0.00–0.49)

## 2021-11-10 NOTE — Addendum Note (Signed)
Addended by: Warden Fillers on: 11/10/2021 12:35 PM   Modules accepted: Orders

## 2021-11-10 NOTE — Assessment & Plan Note (Signed)
Right calf is slightly larger than left  And she feels tightness along the lateral length of lower leg after an 8 hour drive home from [place] several days ago. Negative homan's sign on exam.  D Dimer ordered

## 2021-12-09 ENCOUNTER — Other Ambulatory Visit: Payer: Self-pay | Admitting: Internal Medicine

## 2021-12-09 ENCOUNTER — Other Ambulatory Visit: Payer: Self-pay

## 2021-12-09 ENCOUNTER — Encounter: Payer: No Typology Code available for payment source | Admitting: Internal Medicine

## 2021-12-09 DIAGNOSIS — R300 Dysuria: Secondary | ICD-10-CM | POA: Insufficient documentation

## 2021-12-09 DIAGNOSIS — N3090 Cystitis, unspecified without hematuria: Secondary | ICD-10-CM

## 2021-12-09 LAB — URINALYSIS, ROUTINE W REFLEX MICROSCOPIC
Bilirubin Urine: NEGATIVE
Hgb urine dipstick: NEGATIVE
Ketones, ur: NEGATIVE
Leukocytes,Ua: NEGATIVE
Nitrite: NEGATIVE
RBC / HPF: NONE SEEN (ref 0–?)
Specific Gravity, Urine: 1.015 (ref 1.000–1.030)
Total Protein, Urine: NEGATIVE
Urine Glucose: NEGATIVE
Urobilinogen, UA: 1 (ref 0.0–1.0)
pH: 7.5 (ref 5.0–8.0)

## 2021-12-09 LAB — POCT URINALYSIS DIPSTICK
Bilirubin, UA: POSITIVE
Blood, UA: NEGATIVE
Glucose, UA: NEGATIVE
Ketones, UA: POSITIVE
Nitrite, UA: POSITIVE
Protein, UA: POSITIVE — AB
Spec Grav, UA: 1.015 (ref 1.010–1.025)
Urobilinogen, UA: 2 E.U./dL — AB
pH, UA: 7 (ref 5.0–8.0)

## 2021-12-09 MED ORDER — CIPROFLOXACIN HCL 250 MG PO TABS
250.0000 mg | ORAL_TABLET | Freq: Two times a day (BID) | ORAL | 0 refills | Status: AC
  Filled 2021-12-09: qty 6, 3d supply, fill #0

## 2021-12-09 NOTE — Assessment & Plan Note (Signed)
POC UA  suggestive of UTI>  Micro and culture ordered.  Rx  empiric cipro since she is going out of town tomorrow

## 2021-12-09 NOTE — Addendum Note (Signed)
Addended by: Clearnce Sorrel on: 12/09/2021 01:22 PM   Modules accepted: Orders

## 2021-12-10 LAB — URINE CULTURE
MICRO NUMBER:: 13885532
Result:: NO GROWTH
SPECIMEN QUALITY:: ADEQUATE

## 2022-01-06 ENCOUNTER — Other Ambulatory Visit: Payer: Self-pay | Admitting: Internal Medicine

## 2022-01-06 ENCOUNTER — Other Ambulatory Visit (INDEPENDENT_AMBULATORY_CARE_PROVIDER_SITE_OTHER): Payer: No Typology Code available for payment source

## 2022-01-06 DIAGNOSIS — R350 Frequency of micturition: Secondary | ICD-10-CM | POA: Diagnosis not present

## 2022-01-06 DIAGNOSIS — R5383 Other fatigue: Secondary | ICD-10-CM

## 2022-01-06 DIAGNOSIS — E559 Vitamin D deficiency, unspecified: Secondary | ICD-10-CM

## 2022-01-06 DIAGNOSIS — R635 Abnormal weight gain: Secondary | ICD-10-CM | POA: Diagnosis not present

## 2022-01-06 DIAGNOSIS — E538 Deficiency of other specified B group vitamins: Secondary | ICD-10-CM

## 2022-01-06 DIAGNOSIS — D649 Anemia, unspecified: Secondary | ICD-10-CM

## 2022-01-06 LAB — URINALYSIS, ROUTINE W REFLEX MICROSCOPIC
Bilirubin Urine: NEGATIVE
Hgb urine dipstick: NEGATIVE
Ketones, ur: NEGATIVE
Leukocytes,Ua: NEGATIVE
Nitrite: NEGATIVE
Specific Gravity, Urine: 1.01 (ref 1.000–1.030)
Total Protein, Urine: NEGATIVE
Urine Glucose: NEGATIVE
Urobilinogen, UA: 0.2 (ref 0.0–1.0)
pH: 7.5 (ref 5.0–8.0)

## 2022-01-06 LAB — CBC WITH DIFFERENTIAL/PLATELET
Basophils Absolute: 0 10*3/uL (ref 0.0–0.1)
Basophils Relative: 0.7 % (ref 0.0–3.0)
Eosinophils Absolute: 0.1 10*3/uL (ref 0.0–0.7)
Eosinophils Relative: 1.3 % (ref 0.0–5.0)
HCT: 34.6 % — ABNORMAL LOW (ref 36.0–46.0)
Hemoglobin: 11 g/dL — ABNORMAL LOW (ref 12.0–15.0)
Lymphocytes Relative: 26.7 % (ref 12.0–46.0)
Lymphs Abs: 1.4 10*3/uL (ref 0.7–4.0)
MCHC: 31.9 g/dL (ref 30.0–36.0)
MCV: 83.5 fl (ref 78.0–100.0)
Monocytes Absolute: 0.3 10*3/uL (ref 0.1–1.0)
Monocytes Relative: 6.3 % (ref 3.0–12.0)
Neutro Abs: 3.4 10*3/uL (ref 1.4–7.7)
Neutrophils Relative %: 65 % (ref 43.0–77.0)
Platelets: 329 10*3/uL (ref 150.0–400.0)
RBC: 4.14 Mil/uL (ref 3.87–5.11)
RDW: 16.3 % — ABNORMAL HIGH (ref 11.5–15.5)
WBC: 5.3 10*3/uL (ref 4.0–10.5)

## 2022-01-06 LAB — COMPREHENSIVE METABOLIC PANEL
ALT: 13 U/L (ref 0–35)
AST: 14 U/L (ref 0–37)
Albumin: 4 g/dL (ref 3.5–5.2)
Alkaline Phosphatase: 62 U/L (ref 39–117)
BUN: 11 mg/dL (ref 6–23)
CO2: 27 mEq/L (ref 19–32)
Calcium: 9.2 mg/dL (ref 8.4–10.5)
Chloride: 101 mEq/L (ref 96–112)
Creatinine, Ser: 0.74 mg/dL (ref 0.40–1.20)
GFR: 105.1 mL/min (ref >60.00)
Glucose, Bld: 98 mg/dL (ref 70–99)
Potassium: 4 mEq/L (ref 3.5–5.1)
Sodium: 134 mEq/L — ABNORMAL LOW (ref 135–145)
Total Bilirubin: 0.4 mg/dL (ref 0.2–1.2)
Total Protein: 6.7 g/dL (ref 6.0–8.3)

## 2022-01-06 LAB — LIPID PANEL
Cholesterol: 175 mg/dL (ref 0–200)
HDL: 64.7 mg/dL (ref >39.00)
LDL Cholesterol: 89 mg/dL (ref 0–99)
NonHDL: 109.99
Total CHOL/HDL Ratio: 3
Triglycerides: 104 mg/dL (ref 0.0–149.0)
VLDL: 20.8 mg/dL (ref 0.0–40.0)

## 2022-01-06 LAB — TSH: TSH: 1.89 u[IU]/mL (ref 0.35–5.50)

## 2022-01-06 LAB — B12 AND FOLATE PANEL
Folate: 11 ng/mL (ref >5.9)
Vitamin B-12: 105 pg/mL — ABNORMAL LOW (ref 211–911)

## 2022-01-06 LAB — VITAMIN D 25 HYDROXY (VIT D DEFICIENCY, FRACTURES): VITD: 30.35 ng/mL (ref 30.00–100.00)

## 2022-01-06 LAB — HEMOGLOBIN A1C: Hgb A1c MFr Bld: 6.2 % (ref 4.6–6.5)

## 2022-01-06 NOTE — Addendum Note (Signed)
Addended by: Crecencio Mc on: 01/06/2022 06:00 PM   Modules accepted: Orders

## 2022-01-07 ENCOUNTER — Ambulatory Visit (INDEPENDENT_AMBULATORY_CARE_PROVIDER_SITE_OTHER): Payer: No Typology Code available for payment source

## 2022-01-07 DIAGNOSIS — E538 Deficiency of other specified B group vitamins: Secondary | ICD-10-CM | POA: Diagnosis not present

## 2022-01-07 LAB — URINE CULTURE
MICRO NUMBER:: 14012953
SPECIMEN QUALITY:: ADEQUATE

## 2022-01-07 MED ORDER — CYANOCOBALAMIN 1000 MCG/ML IJ SOLN
1000.0000 ug | Freq: Once | INTRAMUSCULAR | Status: AC
  Administered 2022-01-07: 1000 ug via INTRAMUSCULAR

## 2022-01-07 NOTE — Progress Notes (Signed)
Patient presented for B 12 injection to left deltoid, patient voiced no concerns nor showed any signs of distress during injection. 

## 2022-01-14 ENCOUNTER — Ambulatory Visit (INDEPENDENT_AMBULATORY_CARE_PROVIDER_SITE_OTHER): Payer: No Typology Code available for payment source

## 2022-01-14 DIAGNOSIS — E611 Iron deficiency: Secondary | ICD-10-CM

## 2022-01-14 DIAGNOSIS — Z9049 Acquired absence of other specified parts of digestive tract: Secondary | ICD-10-CM

## 2022-01-14 DIAGNOSIS — D649 Anemia, unspecified: Secondary | ICD-10-CM | POA: Diagnosis not present

## 2022-01-14 DIAGNOSIS — E538 Deficiency of other specified B group vitamins: Secondary | ICD-10-CM | POA: Diagnosis not present

## 2022-01-14 LAB — IBC + FERRITIN
Ferritin: 3.9 ng/mL — ABNORMAL LOW (ref 10.0–291.0)
Iron: 22 ug/dL — ABNORMAL LOW (ref 42–145)
Saturation Ratios: 4.5 % — ABNORMAL LOW (ref 20.0–50.0)
TIBC: 484.4 ug/dL — ABNORMAL HIGH (ref 250.0–450.0)
Transferrin: 346 mg/dL (ref 212.0–360.0)

## 2022-01-14 MED ORDER — CYANOCOBALAMIN 1000 MCG/ML IJ SOLN
1000.0000 ug | Freq: Once | INTRAMUSCULAR | Status: AC
  Administered 2022-01-14: 1000 ug via INTRAMUSCULAR

## 2022-01-14 NOTE — Progress Notes (Signed)
Patient presented for B 12 injection to left deltoid, patient voiced no concerns nor showed any signs of distress during injection. 

## 2022-01-15 DIAGNOSIS — Z9049 Acquired absence of other specified parts of digestive tract: Secondary | ICD-10-CM | POA: Insufficient documentation

## 2022-01-15 DIAGNOSIS — E611 Iron deficiency: Secondary | ICD-10-CM | POA: Insufficient documentation

## 2022-01-16 LAB — INTRINSIC FACTOR ANTIBODIES: Intrinsic Factor: POSITIVE — AB

## 2022-01-17 ENCOUNTER — Other Ambulatory Visit: Payer: Self-pay | Admitting: Internal Medicine

## 2022-01-17 ENCOUNTER — Other Ambulatory Visit: Payer: Self-pay

## 2022-01-17 MED ORDER — CYANOCOBALAMIN 1000 MCG/ML IJ SOLN
1000.0000 ug | INTRAMUSCULAR | 3 refills | Status: DC
  Filled 2022-01-17: qty 3, 90d supply, fill #0

## 2022-01-17 MED ORDER — "SYRINGE 25G X 1"" 3 ML MISC"
0 refills | Status: AC
  Filled 2022-01-17: qty 50, fill #0

## 2022-01-17 MED ORDER — CYANOCOBALAMIN 1000 MCG/ML IJ SOLN
1000.0000 ug | INTRAMUSCULAR | 0 refills | Status: DC
  Filled 2022-01-17: qty 4, 28d supply, fill #0

## 2022-01-18 ENCOUNTER — Other Ambulatory Visit: Payer: Self-pay

## 2022-02-14 ENCOUNTER — Other Ambulatory Visit: Payer: Self-pay

## 2022-02-14 MED ORDER — TRANEXAMIC ACID 650 MG PO TABS
ORAL_TABLET | ORAL | 3 refills | Status: DC
  Filled 2022-02-14: qty 30, 5d supply, fill #0

## 2022-02-28 ENCOUNTER — Other Ambulatory Visit: Payer: Self-pay

## 2022-03-01 ENCOUNTER — Other Ambulatory Visit: Payer: Self-pay

## 2022-03-15 NOTE — Progress Notes (Signed)
This encounter was created in error - please disregard.

## 2022-03-18 ENCOUNTER — Telehealth: Payer: Self-pay | Admitting: Internal Medicine

## 2022-03-18 IMAGING — DX DG CHEST 2V
2 series · 2 of 2 positions shown · non-contrast
Comparison: 05/21/2020

CLINICAL DATA: Persistent cough. Abnormal breath sounds in left
lower lung.

EXAM:
CHEST - 2 VIEW

[chest pa]
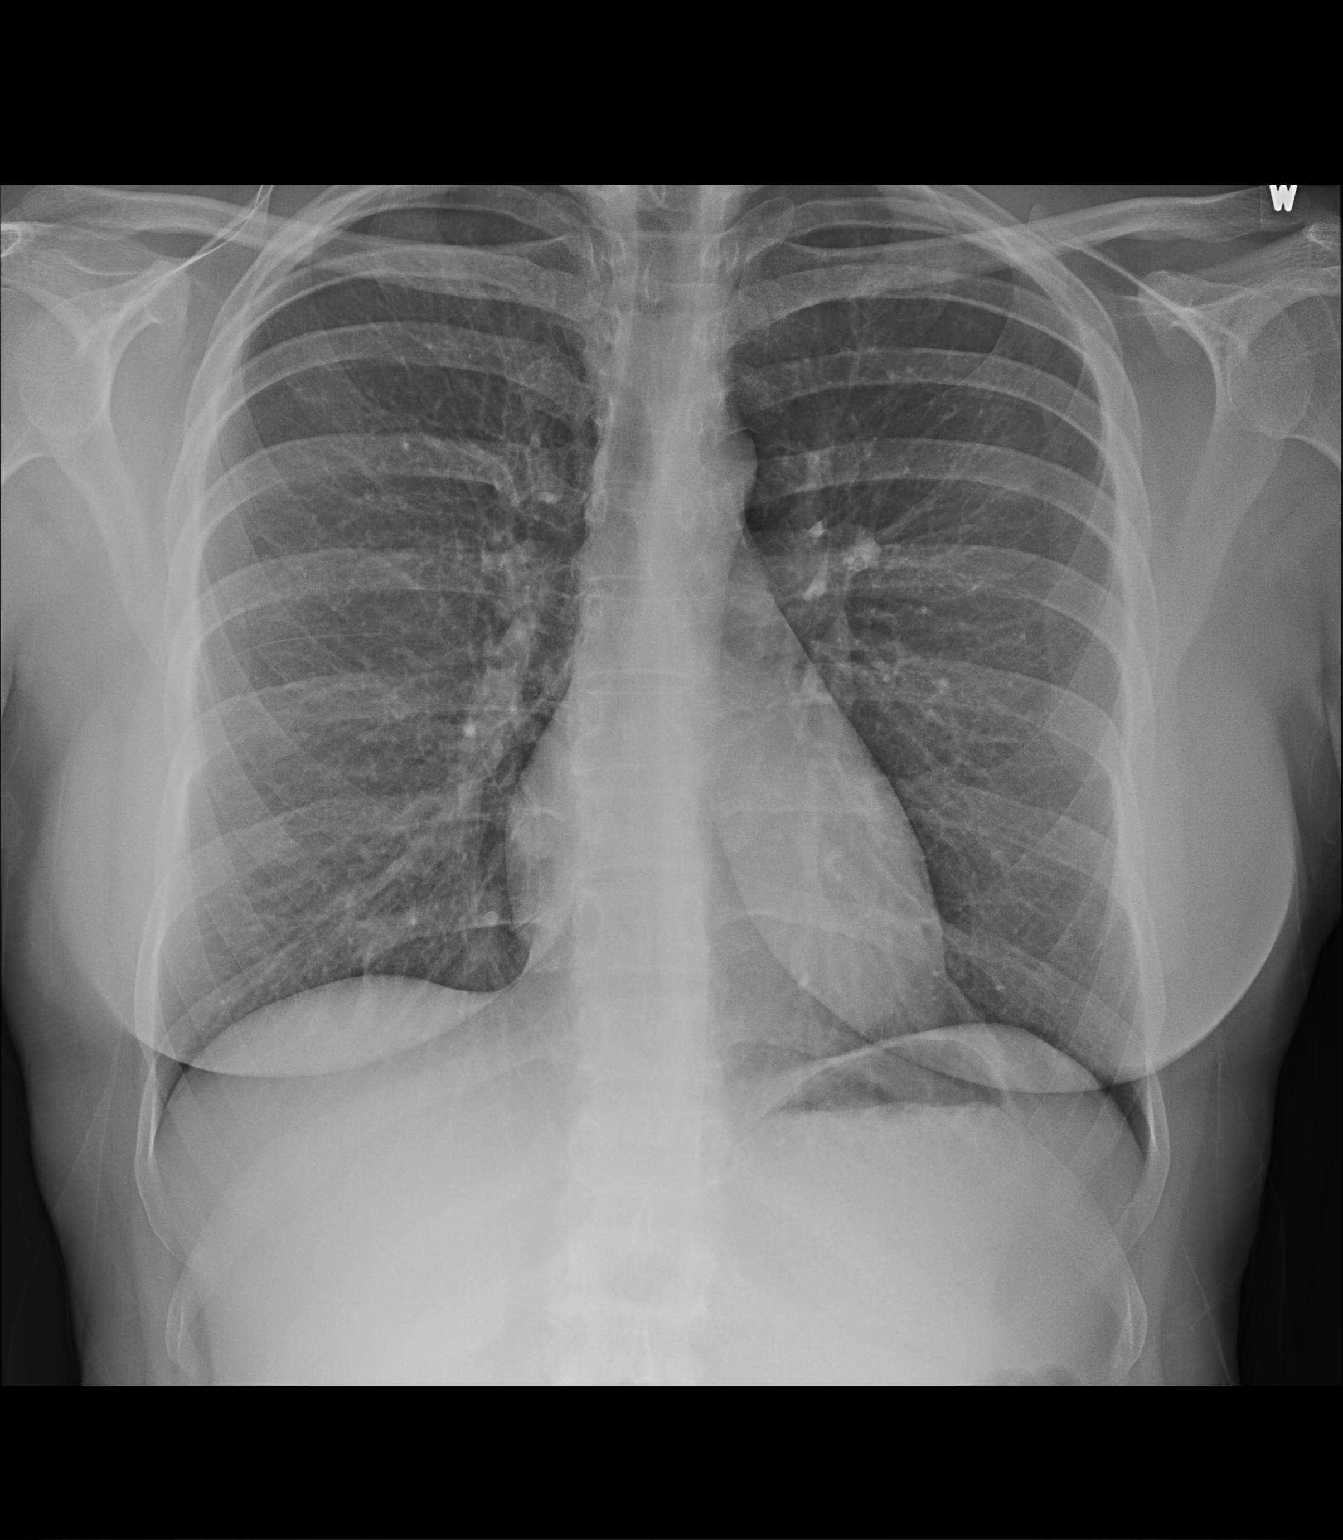

[chest lat]
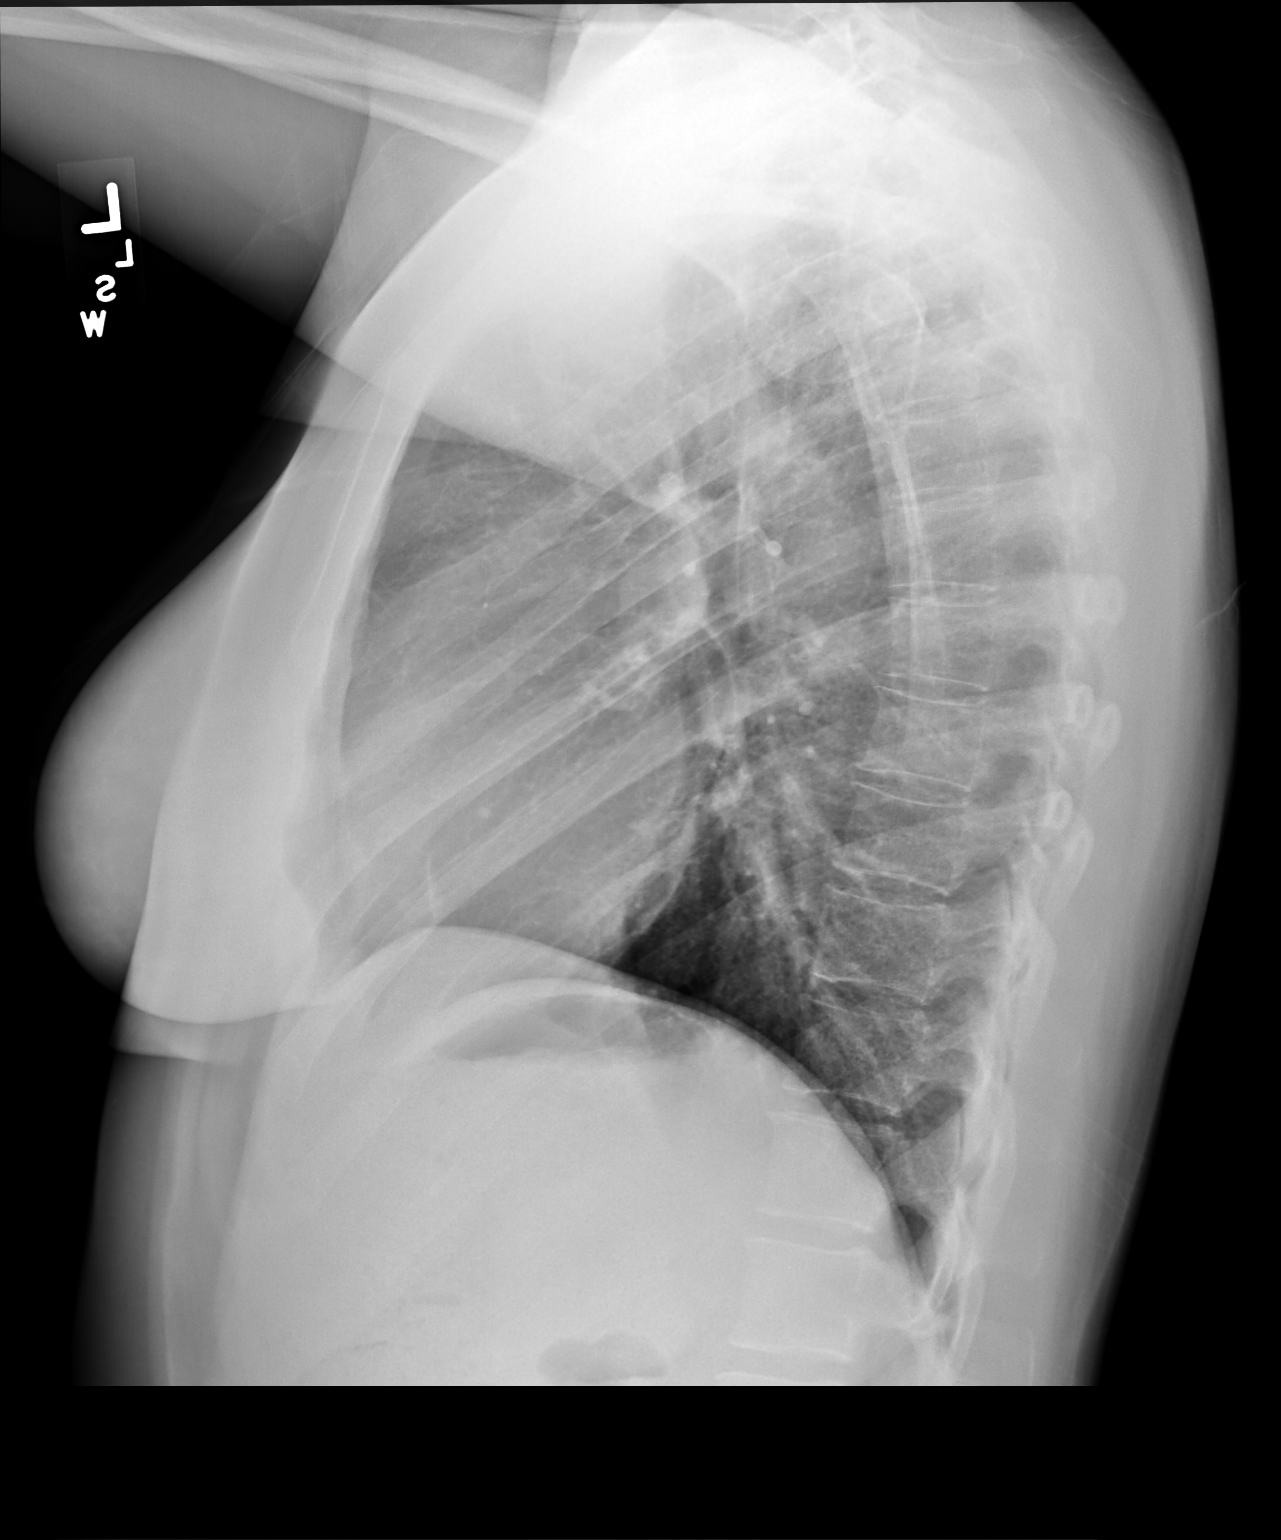

[2 of 2 positions shown; findings below may reference images not displayed]

FINDINGS: The heart size and mediastinal contours are within normal limits.
Both lungs are clear. The visualized skeletal structures are
unremarkable.
IMPRESSION: Normal exam.

## 2022-03-24 ENCOUNTER — Other Ambulatory Visit: Payer: Self-pay

## 2022-03-24 MED FILL — Sertraline HCl Tab 25 MG: ORAL | 90 days supply | Qty: 135 | Fill #2 | Status: AC

## 2022-04-01 ENCOUNTER — Other Ambulatory Visit (INDEPENDENT_AMBULATORY_CARE_PROVIDER_SITE_OTHER): Payer: No Typology Code available for payment source

## 2022-04-01 ENCOUNTER — Other Ambulatory Visit: Payer: Self-pay | Admitting: Internal Medicine

## 2022-04-01 DIAGNOSIS — E538 Deficiency of other specified B group vitamins: Secondary | ICD-10-CM

## 2022-04-01 DIAGNOSIS — E611 Iron deficiency: Secondary | ICD-10-CM

## 2022-04-01 LAB — IBC + FERRITIN
Ferritin: 6.2 ng/mL — ABNORMAL LOW (ref 10.0–291.0)
Iron: 32 ug/dL — ABNORMAL LOW (ref 42–145)
Saturation Ratios: 6.8 % — ABNORMAL LOW (ref 20.0–50.0)
TIBC: 470.4 ug/dL — ABNORMAL HIGH (ref 250.0–450.0)
Transferrin: 336 mg/dL (ref 212.0–360.0)

## 2022-04-01 LAB — CBC WITH DIFFERENTIAL/PLATELET
Basophils Absolute: 0 10*3/uL (ref 0.0–0.1)
Basophils Relative: 0.6 % (ref 0.0–3.0)
Eosinophils Absolute: 0.1 10*3/uL (ref 0.0–0.7)
Eosinophils Relative: 1.5 % (ref 0.0–5.0)
HCT: 38.2 % (ref 36.0–46.0)
Hemoglobin: 12.3 g/dL (ref 12.0–15.0)
Lymphocytes Relative: 27.9 % (ref 12.0–46.0)
Lymphs Abs: 1.7 10*3/uL (ref 0.7–4.0)
MCHC: 32.3 g/dL (ref 30.0–36.0)
MCV: 86.9 fl (ref 78.0–100.0)
Monocytes Absolute: 0.4 10*3/uL (ref 0.1–1.0)
Monocytes Relative: 7.2 % (ref 3.0–12.0)
Neutro Abs: 3.9 10*3/uL (ref 1.4–7.7)
Neutrophils Relative %: 62.8 % (ref 43.0–77.0)
Platelets: 410 10*3/uL — ABNORMAL HIGH (ref 150.0–400.0)
RBC: 4.39 Mil/uL (ref 3.87–5.11)
RDW: 16.9 % — ABNORMAL HIGH (ref 11.5–15.5)
WBC: 6.2 10*3/uL (ref 4.0–10.5)

## 2022-04-01 LAB — VITAMIN B12: Vitamin B-12: 215 pg/mL (ref 211–911)

## 2022-04-04 NOTE — Telephone Encounter (Signed)
Reviewed

## 2022-05-05 ENCOUNTER — Other Ambulatory Visit: Payer: Self-pay

## 2022-05-05 ENCOUNTER — Telehealth: Payer: Self-pay

## 2022-05-05 MED ORDER — CYANOCOBALAMIN 1000 MCG/ML IJ SOLN
1000.0000 ug | INTRAMUSCULAR | 0 refills | Status: DC
  Filled 2022-05-05 – 2022-05-27 (×2): qty 3, 90d supply, fill #0
  Filled 2022-08-22: qty 3, 90d supply, fill #1
  Filled 2022-11-16: qty 3, 90d supply, fill #2
  Filled 2023-02-10: qty 1, 30d supply, fill #3

## 2022-05-05 NOTE — Telephone Encounter (Signed)
B12 injections refilled per patient request/

## 2022-05-15 ENCOUNTER — Other Ambulatory Visit: Payer: Self-pay

## 2022-05-27 ENCOUNTER — Other Ambulatory Visit: Payer: Self-pay

## 2022-05-30 ENCOUNTER — Other Ambulatory Visit: Payer: Self-pay

## 2022-05-30 ENCOUNTER — Other Ambulatory Visit: Payer: Self-pay | Admitting: Internal Medicine

## 2022-05-30 MED ORDER — AZITHROMYCIN 500 MG PO TABS
500.0000 mg | ORAL_TABLET | Freq: Every day | ORAL | 0 refills | Status: DC
  Filled 2022-05-30: qty 7, 7d supply, fill #0

## 2022-06-27 ENCOUNTER — Other Ambulatory Visit: Payer: Self-pay | Admitting: Family

## 2022-06-27 ENCOUNTER — Ambulatory Visit
Admission: RE | Admit: 2022-06-27 | Discharge: 2022-06-27 | Disposition: A | Discharge status: Home / Self Care | Payer: 59 | Source: Ambulatory Visit | Attending: Family | Admitting: Family

## 2022-06-27 ENCOUNTER — Encounter: Payer: Self-pay | Admitting: Family

## 2022-06-27 ENCOUNTER — Ambulatory Visit (INDEPENDENT_AMBULATORY_CARE_PROVIDER_SITE_OTHER): Payer: 59 | Admitting: Family

## 2022-06-27 VITALS — BP 124/76 | HR 79 | Temp 97.6°F | Ht 67.0 in | Wt 167.6 lb

## 2022-06-27 DIAGNOSIS — R1031 Right lower quadrant pain: Secondary | ICD-10-CM

## 2022-06-27 DIAGNOSIS — N390 Urinary tract infection, site not specified: Secondary | ICD-10-CM

## 2022-06-27 DIAGNOSIS — R109 Unspecified abdominal pain: Secondary | ICD-10-CM | POA: Diagnosis not present

## 2022-06-27 LAB — POCT URINALYSIS DIPSTICK
Blood, UA: NEGATIVE
Glucose, UA: NEGATIVE
Ketones, UA: 15
Leukocytes, UA: NEGATIVE
Nitrite, UA: NEGATIVE
Protein, UA: NEGATIVE
Spec Grav, UA: 1.025 (ref 1.010–1.025)
Urobilinogen, UA: 0.2 E.U./dL
pH, UA: 6 (ref 5.0–8.0)

## 2022-06-27 LAB — URINALYSIS, ROUTINE W REFLEX MICROSCOPIC
Bilirubin Urine: NEGATIVE
Hgb urine dipstick: NEGATIVE
Ketones, ur: 15 — AB
Leukocytes,Ua: NEGATIVE
Nitrite: NEGATIVE
RBC / HPF: NONE SEEN (ref 0–?)
Specific Gravity, Urine: 1.025 (ref 1.000–1.030)
Total Protein, Urine: NEGATIVE
Urine Glucose: NEGATIVE
Urobilinogen, UA: 0.2 (ref 0.0–1.0)
pH: 6.5 (ref 5.0–8.0)

## 2022-06-27 LAB — POCT URINE PREGNANCY: Preg Test, Ur: NEGATIVE

## 2022-06-27 MED ORDER — IOHEXOL 300 MG/ML  SOLN
100.0000 mL | Freq: Once | INTRAMUSCULAR | Status: AC | PRN
  Administered 2022-06-27: 100 mL via INTRAVENOUS

## 2022-06-27 NOTE — Patient Instructions (Signed)
We will stay in close touch as we get results of CT.  Please let me know how you are doing and certainly if any symptoms were to change.

## 2022-06-27 NOTE — Assessment & Plan Note (Addendum)
Fortunately patient is nontoxic in appearance.  She is afebrile.  Urine point-of-care is negative RBCs, white blood cells, nitrites.  Low clinical suspicion for UTI although we will wait on urine culture.  Concern based on location of pain and exam or ovarian mass versus appendicitis.  She has history of renal calculi.  Pending stat CT abdomen and pelvis.  Urine hcg negative.

## 2022-06-27 NOTE — Progress Notes (Signed)
Assessment & Plan:  Urinary tract infection without hematuria, site unspecified -     Urine Culture -     POCT urinalysis dipstick -     Urinalysis, Routine w reflex microscopic -     POCT urine pregnancy  Right lower quadrant abdominal pain -     CT ABDOMEN PELVIS W CONTRAST  RLQ abdominal pain Assessment & Plan: Fortunately patient is nontoxic in appearance.  She is afebrile.  Urine point-of-care is negative RBCs, white blood cells, nitrites.  Low clinical suspicion for UTI although we will wait on urine culture.  Concern based on location of pain and exam or ovarian mass versus appendicitis.  She has history of renal calculi.  Pending stat CT abdomen and pelvis.  Urine hcg negative.       Return precautions given.   Risks, benefits, and alternatives of the medications and treatment plan prescribed today were discussed, and patient expressed understanding.   Education regarding symptom management and diagnosis given to patient on AVS either electronically or printed.  No follow-ups on file.  Hannah Paris, FNP  Subjective:    Patient ID: Hannah Snow, female    DOB: 20-Nov-1985, 37 y.o.   MRN: [id number]  CC: Hannah Snow is a 37 y.o. female who presents today for an acute visit.    HPI: Complains of RLQ pain x 3 days, comes and goes.  Describes as dull twinge.  She is noticed feeling urinary urgency. Weaker stream of urine.  No dysuria, constipation, low back pain, numbness in legs, hip pain Endorses nausea   Pain is not constant  No recent injury.  LMP 05/22/22 Has IUD , has menses monthly.  Describes heavy menstrual flow.  Previously prescribed Lysteda however she did not start  H/o ovarian cyst right       Follows wit GYN  . Pap History of kidney stones, GERD History of laparoscopic cholecystectomy 05/2020 , Dr Hampton Abbot Smoker   Allergies: Tylenol [acetaminophen], Latex, and Nickel Current Outpatient Medications on File Prior to Visit   Medication Sig Dispense Refill   cyanocobalamin (VITAMIN B12) 1000 MCG/ML injection Inject 1 mL (1,000 mcg total) into the muscle every 30 (thirty) days. 10 mL 0   diltiazem (CARDIZEM) 30 MG tablet Take 1 tablet (30 mg total) by mouth daily. 30 tablet 0   fluticasone (FLONASE) 50 MCG/ACT nasal spray Place 1-2 sprays into both nostrils daily as needed for allergies or rhinitis.     loratadine (CLARITIN) 10 MG tablet Take 10 mg by mouth daily as needed for allergies.     PARAGARD INTRAUTERINE COPPER IUD IUD 1 each by Intrauterine route once.     sertraline (ZOLOFT) 25 MG tablet TAKE 1 & 1/2 TABLETS (37.5 MG TOTAL) BY MOUTH ONCE DAILY 135 tablet 3   Syringe/Needle, Disp, (SYRINGE 3CC/25GX1") 25G X 1" 3 ML MISC Use for b12 injections 50 each 0   No current facility-administered medications on file prior to visit.    Review of Systems  Constitutional:  Negative for chills and fever.  Respiratory:  Negative for cough.   Cardiovascular:  Negative for chest pain and palpitations.  Gastrointestinal:  Positive for abdominal pain. Negative for blood in stool, constipation, diarrhea, nausea and vomiting.  Genitourinary:  Positive for difficulty urinating and urgency.  Skin:  Negative for rash.      Objective:    BP 124/76   Pulse 79   Temp 97.6 F (36.4 C) (Oral)   Ht 5\' 7"  (1.702  m)   Wt 167 lb 9.6 oz (76 kg)   LMP 05/22/2022 (Approximate)   SpO2 97%   BMI 26.25 kg/m   BP Readings from Last 3 Encounters:  06/27/22 124/76  07/29/21 123/79  04/15/21 118/78   Wt Readings from Last 3 Encounters:  06/27/22 167 lb 9.6 oz (76 kg)  07/29/21 159 lb (72.1 kg)  04/15/21 160 lb (72.6 kg)    Physical Exam Vitals reviewed.  Constitutional:      Appearance: Normal appearance. She is well-developed.  Eyes:     Conjunctiva/sclera: Conjunctivae normal.  Cardiovascular:     Rate and Rhythm: Normal rate and regular rhythm.     Pulses: Normal pulses.     Heart sounds: Normal heart sounds.   Pulmonary:     Effort: Pulmonary effort is normal.     Breath sounds: Normal breath sounds. No wheezing, rhonchi or rales.  Abdominal:     General: Bowel sounds are normal. There is no distension.     Palpations: Abdomen is soft. Abdomen is not rigid. There is no fluid wave or mass.     Tenderness: There is abdominal tenderness in the right lower quadrant. There is no right CVA tenderness, left CVA tenderness, guarding or rebound. Positive signs include McBurney's sign.     Comments: Negative heel jar test  Musculoskeletal:     Lumbar back: No swelling, edema, spasms, tenderness or bony tenderness. Normal range of motion. Negative right straight leg raise test and negative left straight leg raise test.     Comments: Full range of motion with flexion, tension, lateral side bends. No bony tenderness. No pain, numbness, tingling elicited with single leg raise bilaterally.  Bilateral  Hip: No limp or waddling gait. Full ROM with flexion and hip rotation in flexion.    No pain of lateral hip with  (flexion-abduction-external rotation) test.   No pain with deep palpation of greater trochanter.     Skin:    General: Skin is warm and dry.  Neurological:     Mental Status: She is alert.     Sensory: No sensory deficit.     Deep Tendon Reflexes:     Reflex Scores:      Patellar reflexes are 2+ on the right side and 2+ on the left side.    Comments: Sensation and strength intact bilateral lower extremities.  Psychiatric:        Speech: Speech normal.        Behavior: Behavior normal.        Thought Content: Thought content normal.

## 2022-06-28 ENCOUNTER — Ambulatory Visit
Admission: RE | Admit: 2022-06-28 | Discharge: 2022-06-28 | Disposition: A | Discharge status: Home / Self Care | Payer: 59 | Source: Ambulatory Visit | Attending: Family | Admitting: Family

## 2022-06-28 DIAGNOSIS — R1031 Right lower quadrant pain: Secondary | ICD-10-CM | POA: Insufficient documentation

## 2022-06-28 LAB — URINE CULTURE
MICRO NUMBER:: 14736705
SPECIMEN QUALITY:: ADEQUATE

## 2022-07-20 ENCOUNTER — Other Ambulatory Visit: Payer: Self-pay

## 2022-07-20 MED ORDER — OMEPRAZOLE 20 MG PO CPDR
20.0000 mg | DELAYED_RELEASE_CAPSULE | Freq: Two times a day (BID) | ORAL | 3 refills | Status: DC | PRN
  Filled 2022-07-20: qty 180, 90d supply, fill #0

## 2022-07-21 ENCOUNTER — Other Ambulatory Visit: Payer: Self-pay

## 2022-07-21 MED ORDER — AMOXICILLIN 500 MG PO CAPS
500.0000 mg | ORAL_CAPSULE | Freq: Three times a day (TID) | ORAL | 0 refills | Status: DC
  Filled 2022-07-21: qty 15, 5d supply, fill #0

## 2022-08-22 ENCOUNTER — Other Ambulatory Visit: Payer: Self-pay

## 2022-09-29 ENCOUNTER — Ambulatory Visit (INDEPENDENT_AMBULATORY_CARE_PROVIDER_SITE_OTHER): Payer: 59 | Admitting: Internal Medicine

## 2022-09-29 ENCOUNTER — Other Ambulatory Visit: Payer: Self-pay

## 2022-09-29 ENCOUNTER — Encounter: Payer: Self-pay | Admitting: Internal Medicine

## 2022-09-29 VITALS — BP 116/80 | HR 79 | Temp 97.8°F | Ht 66.0 in | Wt 168.8 lb

## 2022-09-29 DIAGNOSIS — S29012A Strain of muscle and tendon of back wall of thorax, initial encounter: Secondary | ICD-10-CM

## 2022-09-29 HISTORY — DX: Strain of muscle and tendon of back wall of thorax, initial encounter: S29.012A

## 2022-09-29 MED ORDER — CYCLOBENZAPRINE HCL 10 MG PO TABS
10.0000 mg | ORAL_TABLET | Freq: Three times a day (TID) | ORAL | 0 refills | Status: DC | PRN
  Filled 2022-09-29: qty 30, 10d supply, fill #0

## 2022-09-29 MED ORDER — CELECOXIB 200 MG PO CAPS
200.0000 mg | ORAL_CAPSULE | Freq: Two times a day (BID) | ORAL | 0 refills | Status: DC
  Filled 2022-09-29: qty 60, 30d supply, fill #0

## 2022-09-29 NOTE — Patient Instructions (Signed)
Your rhomboid and trapezius muscles are in spasm.   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   Massage with "the percussonator"  to help force the muscles to relax  Gently stretch the muscles by bending over and letting arms dangle for a minute while Deep breathing .  Do this 3 times daily

## 2022-09-29 NOTE — Progress Notes (Signed)
Subjective:  Patient ID: Hannah Snow, female    DOB: 09-Dec-1985  Age: 36 y.o. MRN: [id number]  CC: The encounter diagnosis was Strain of rhomboid muscle, initial encounter.   HPI Hannah Snow presents for  Chief Complaint  Patient presents with   Neck Pain    One month history of pain at the base of the neck. . May have been aggravated by a night sleeping on couch, recurrent farm duties including  baling hay,  lifting heavy pails,  shoveling and gardening.  .  No trauma history.   Feels a knot on the left side of spine  the know is tender and aggravated by bumpy riding on the scooter.  Has tried only ibuprofen ,  has not used hear or ice  no massage except by family members . Pain and limited ROM with neck rotation to left.  No loss of strength in arms and no radiation  to either arm   Outpatient Medications Prior to Visit  Medication Sig Dispense Refill   ferrous sulfate 325 (65 FE) MG EC tablet Take 325 mg by mouth daily.     metoprolol tartrate (LOPRESSOR) 25 MG tablet Take 12.5 mg by mouth as needed (for rapid heart rate.).     Simethicone 125 MG CAPS Take 2 capsules by mouth as needed.     amoxicillin (AMOXIL) 500 MG capsule Take 2 capsules (1000mg ) by mouth now, then 1 capsule by mouth 3 (three) times daily. (Patient not taking: Reported on 09/29/2022) 15 capsule 0   cyanocobalamin (VITAMIN B12) 1000 MCG/ML injection Inject 1 mL (1,000 mcg total) into the muscle every 30 (thirty) days. 10 mL 0   fluticasone (FLONASE) 50 MCG/ACT nasal spray Place 1-2 sprays into both nostrils daily as needed for allergies or rhinitis.     loratadine (CLARITIN) 10 MG tablet Take 10 mg by mouth daily as needed for allergies.     omeprazole (PRILOSEC) 20 MG capsule Take 1 capsule (20 mg total) by mouth 2 (two) times daily as needed. 180 capsule 3   PARAGARD INTRAUTERINE COPPER IUD IUD 1 each by Intrauterine route once.     sertraline (ZOLOFT) 25 MG tablet TAKE 1 & 1/2 TABLETS (37.5 MG TOTAL)  BY MOUTH ONCE DAILY 135 tablet 3   Syringe/Needle, Disp, (SYRINGE 3CC/25GX1") 25G X 1" 3 ML MISC Use for b12 injections 50 each 0   diltiazem (CARDIZEM) 30 MG tablet Take 1 tablet (30 mg total) by mouth daily. 30 tablet 0   No facility-administered medications prior to visit.    Review of Systems;  Patient denies headache, fevers, malaise, unintentional weight loss, skin rash, eye pain, sinus congestion and sinus pain, sore throat, dysphagia,  hemoptysis , cough, dyspnea, wheezing, chest pain, palpitations, orthopnea, edema, abdominal pain, nausea, melena, diarrhea, constipation, flank pain, dysuria, hematuria, urinary  Frequency, nocturia, numbness, tingling, seizures,  Focal weakness, Loss of consciousness,  Tremor, insomnia, depression, anxiety, and suicidal ideation.      Objective:  BP 116/80   Pulse 79   Temp 97.8 F (36.6 C)   Ht 5\' 6"  (1.676 m)   Wt 168 lb 12.8 oz (76.6 kg)   SpO2 98%   BMI 27.25 kg/m   BP Readings from Last 3 Encounters:  09/29/22 116/80  06/27/22 124/76  07/29/21 123/79    Wt Readings from Last 3 Encounters:  09/29/22 168 lb 12.8 oz (76.6 kg)  06/27/22 167 lb 9.6 oz (76 kg)  07/29/21 159 lb (72.1 kg)  Physical Exam Vitals reviewed.  Constitutional:      General: She is not in acute distress.    Appearance: Normal appearance. She is normal weight. She is not ill-appearing, toxic-appearing or diaphoretic.  HENT:     Head: Normocephalic.  Eyes:     General: No scleral icterus.       Right eye: No discharge.        Left eye: No discharge.     Conjunctiva/sclera: Conjunctivae normal.  Musculoskeletal:        General: Normal range of motion.       Arms:     Cervical back: No rigidity, tenderness or bony tenderness. Pain with movement present.     Thoracic back: Spasms present.       Back:     Comments: Pain elicited in left  rhomboid with head turn to left    Skin:    General: Skin is warm and dry.  Neurological:     General: No  focal deficit present.     Mental Status: She is alert and oriented to person, place, and time. Mental status is at baseline.  Psychiatric:        Mood and Affect: Mood normal.        Behavior: Behavior normal.        Thought Content: Thought content normal.        Judgment: Judgment normal.    Lab Results  Component Value Date   HGBA1C 6.2 01/06/2022    Lab Results  Component Value Date   CREATININE 0.74 01/06/2022   CREATININE 0.77 04/15/2021   CREATININE 0.80 12/02/2020    Lab Results  Component Value Date   WBC 6.2 04/01/2022   HGB 12.3 04/01/2022   HCT 38.2 04/01/2022   PLT 410.0 (H) 04/01/2022   GLUCOSE 98 01/06/2022   CHOL 175 01/06/2022   TRIG 104.0 01/06/2022   HDL 64.70 01/06/2022   LDLCALC 89 01/06/2022   ALT 13 01/06/2022   AST 14 01/06/2022   NA 134 (L) 01/06/2022   K 4.0 01/06/2022   CL 101 01/06/2022   CREATININE 0.74 01/06/2022   BUN 11 01/06/2022   CO2 27 01/06/2022   TSH 1.89 01/06/2022   HGBA1C 6.2 01/06/2022    US Pelvic Complete With Transvaginal  Result Date: 06/28/2022 CLINICAL DATA:  Right lower quadrant abdominal pain for 5 days. EXAM: TRANSABDOMINAL AND TRANSVAGINAL ULTRASOUND OF PELVIS TECHNIQUE: Both transabdominal and transvaginal ultrasound examinations of the pelvis were performed. Transabdominal technique was performed for global imaging of the pelvis including uterus, ovaries, adnexal regions, and pelvic cul-de-sac. It was necessary to proceed with endovaginal exam following the transabdominal exam to visualize the ovaries and endometrium. COMPARISON:  CT scan, same date. FINDINGS: Uterus Measurements: 9.0 x 4.2 x 5.0 cm = volume: 98.3 ML. No myometrial abnormalities. Endometrium Thickness: 10 mm. Low lying IUD in the lower endometrial canal and cervix. Right ovary Measurements: 2.5 x 1.8 x 2.4 cm = volume: 5.6 mL. Normal appearance/no adnexal mass. Left ovary Measurements: 2.6 x 2.2 x 2.2 cm = volume: 6.4 mL. Normal appearance/no adnexal  mass. Other findings No abnormal free fluid. IMPRESSION: 1. Low lying IUD in the lower endometrial canal and cervix. 2. Otherwise normal pelvic ultrasound examination. Electronically Signed   By: Rudie Meyer M.D.   On: 06/28/2022 15:02    Assessment & Plan:  .Strain of rhomboid muscle, initial encounter Assessment & Plan: Left side,  due to excessive lifting on farm.  Activity modification,  NSAIDS/tylenol and flexeril.  Massage    Other orders -     Cyclobenzaprine HCl; Take 1 tablet (10 mg total) by mouth 3 (three) times daily as needed for muscle spasms.  Dispense: 30 tablet; Refill: 0 -     Celecoxib; Take 1 capsule (200 mg total) by mouth 2 (two) times daily.  Dispense: 60 capsule; Refill: 0    Follow-up: No follow-ups on file.   Sherlene Shams, MD

## 2022-09-29 NOTE — Assessment & Plan Note (Signed)
Left side,  due to excessive lifting on farm.  Activity modification, NSAIDS/tylenol and flexeril.  Massage

## 2022-10-11 ENCOUNTER — Other Ambulatory Visit: Payer: Self-pay

## 2022-11-16 ENCOUNTER — Other Ambulatory Visit: Payer: Self-pay

## 2022-11-16 MED ORDER — SERTRALINE HCL 25 MG PO TABS
37.5000 mg | ORAL_TABLET | Freq: Every day | ORAL | 3 refills | Status: DC
  Filled 2022-11-16: qty 135, 90d supply, fill #0
  Filled 2023-02-10: qty 135, 90d supply, fill #1

## 2022-11-23 ENCOUNTER — Other Ambulatory Visit: Payer: Self-pay | Admitting: Internal Medicine

## 2022-11-23 ENCOUNTER — Other Ambulatory Visit: Payer: Self-pay

## 2022-11-23 MED ORDER — PREDNISONE 10 MG PO TABS
ORAL_TABLET | ORAL | 0 refills | Status: AC
  Filled 2022-11-23: qty 21, 6d supply, fill #0

## 2022-11-23 MED ORDER — AMOXICILLIN-POT CLAVULANATE 875-125 MG PO TABS
1.0000 | ORAL_TABLET | Freq: Two times a day (BID) | ORAL | 0 refills | Status: DC
  Filled 2022-11-23: qty 14, 7d supply, fill #0

## 2023-02-07 ENCOUNTER — Other Ambulatory Visit (INDEPENDENT_AMBULATORY_CARE_PROVIDER_SITE_OTHER): Payer: 59

## 2023-02-07 ENCOUNTER — Other Ambulatory Visit: Payer: Self-pay | Admitting: Internal Medicine

## 2023-02-07 DIAGNOSIS — R1011 Right upper quadrant pain: Secondary | ICD-10-CM

## 2023-02-07 LAB — COMPREHENSIVE METABOLIC PANEL
ALT: 22 U/L (ref 0–35)
AST: 19 U/L (ref 0–37)
Albumin: 4 g/dL (ref 3.5–5.2)
Alkaline Phosphatase: 62 U/L (ref 39–117)
BUN: 10 mg/dL (ref 6–23)
CO2: 31 meq/L (ref 19–32)
Calcium: 9.3 mg/dL (ref 8.4–10.5)
Chloride: 102 meq/L (ref 96–112)
Creatinine, Ser: 0.73 mg/dL (ref 0.40–1.20)
GFR: 106.02 mL/min (ref >60.00)
Glucose, Bld: 96 mg/dL (ref 70–99)
Potassium: 3.6 meq/L (ref 3.5–5.1)
Sodium: 137 meq/L (ref 135–145)
Total Bilirubin: 0.2 mg/dL (ref 0.2–1.2)
Total Protein: 7 g/dL (ref 6.0–8.3)

## 2023-02-07 LAB — LIPASE: Lipase: 44 U/L (ref 11.0–59.0)

## 2023-02-10 ENCOUNTER — Other Ambulatory Visit: Payer: Self-pay

## 2023-02-10 ENCOUNTER — Ambulatory Visit
Admission: RE | Admit: 2023-02-10 | Discharge: 2023-02-10 | Disposition: A | Discharge status: Home / Self Care | Payer: 59 | Source: Ambulatory Visit | Attending: Internal Medicine | Admitting: Internal Medicine

## 2023-02-10 ENCOUNTER — Other Ambulatory Visit: Payer: Self-pay | Admitting: Internal Medicine

## 2023-02-10 DIAGNOSIS — R1011 Right upper quadrant pain: Secondary | ICD-10-CM

## 2023-02-17 ENCOUNTER — Other Ambulatory Visit: Payer: Self-pay

## 2023-02-17 MED ORDER — AMOXICILLIN 500 MG PO CAPS
500.0000 mg | ORAL_CAPSULE | Freq: Three times a day (TID) | ORAL | 0 refills | Status: DC
  Filled 2023-02-17: qty 21, 7d supply, fill #0

## 2023-03-20 ENCOUNTER — Other Ambulatory Visit: Payer: Self-pay | Admitting: Internal Medicine

## 2023-03-20 ENCOUNTER — Other Ambulatory Visit: Payer: Self-pay

## 2023-03-20 MED FILL — Cyanocobalamin Inj 1000 MCG/ML: INTRAMUSCULAR | 90 days supply | Qty: 3 | Fill #0 | Status: AC

## 2023-03-21 ENCOUNTER — Other Ambulatory Visit: Payer: Self-pay

## 2023-04-13 DIAGNOSIS — N921 Excessive and frequent menstruation with irregular cycle: Secondary | ICD-10-CM | POA: Diagnosis not present

## 2023-05-26 ENCOUNTER — Other Ambulatory Visit: Payer: Self-pay | Admitting: Internal Medicine

## 2023-05-26 ENCOUNTER — Other Ambulatory Visit: Payer: Self-pay

## 2023-05-26 MED ORDER — MUPIROCIN 2 % EX OINT
1.0000 | TOPICAL_OINTMENT | Freq: Two times a day (BID) | CUTANEOUS | 0 refills | Status: DC
  Filled 2023-05-26: qty 22, 30d supply, fill #0

## 2023-05-29 ENCOUNTER — Other Ambulatory Visit: Payer: Self-pay

## 2023-05-29 DIAGNOSIS — N921 Excessive and frequent menstruation with irregular cycle: Secondary | ICD-10-CM | POA: Diagnosis not present

## 2023-05-29 DIAGNOSIS — Z30011 Encounter for initial prescription of contraceptive pills: Secondary | ICD-10-CM | POA: Diagnosis not present

## 2023-05-29 DIAGNOSIS — Z30432 Encounter for removal of intrauterine contraceptive device: Secondary | ICD-10-CM | POA: Diagnosis not present

## 2023-05-29 MED ORDER — NORETHINDRONE 0.35 MG PO TABS
1.0000 | ORAL_TABLET | Freq: Every day | ORAL | 11 refills | Status: DC
  Filled 2023-05-29: qty 28, 28d supply, fill #0

## 2023-05-29 MED ORDER — IBUPROFEN 800 MG PO TABS
800.0000 mg | ORAL_TABLET | Freq: Three times a day (TID) | ORAL | 1 refills | Status: DC | PRN
  Filled 2023-05-29: qty 30, 10d supply, fill #0

## 2023-05-29 MED ORDER — ONDANSETRON HCL 8 MG PO TABS
8.0000 mg | ORAL_TABLET | Freq: Three times a day (TID) | ORAL | 0 refills | Status: DC | PRN
  Filled 2023-05-29: qty 30, 10d supply, fill #0

## 2023-05-30 ENCOUNTER — Other Ambulatory Visit (INDEPENDENT_AMBULATORY_CARE_PROVIDER_SITE_OTHER): Payer: 59

## 2023-05-30 ENCOUNTER — Other Ambulatory Visit: Payer: Self-pay

## 2023-05-30 DIAGNOSIS — Z01818 Encounter for other preprocedural examination: Secondary | ICD-10-CM

## 2023-05-30 LAB — CBC WITH DIFFERENTIAL/PLATELET
Basophils Absolute: 0 10*3/uL (ref 0.0–0.1)
Basophils Relative: 0.3 % (ref 0.0–3.0)
Eosinophils Absolute: 0.1 10*3/uL (ref 0.0–0.7)
Eosinophils Relative: 1 % (ref 0.0–5.0)
HCT: 40.9 % (ref 36.0–46.0)
Hemoglobin: 13.7 g/dL (ref 12.0–15.0)
Lymphocytes Relative: 20.5 % (ref 12.0–46.0)
Lymphs Abs: 1.6 10*3/uL (ref 0.7–4.0)
MCHC: 33.4 g/dL (ref 30.0–36.0)
MCV: 95.2 fl (ref 78.0–100.0)
Monocytes Absolute: 0.4 10*3/uL (ref 0.1–1.0)
Monocytes Relative: 4.7 % (ref 3.0–12.0)
Neutro Abs: 5.8 10*3/uL (ref 1.4–7.7)
Neutrophils Relative %: 73.5 % (ref 43.0–77.0)
Platelets: 381 10*3/uL (ref 150.0–400.0)
RBC: 4.3 Mil/uL (ref 3.87–5.11)
RDW: 13.7 % (ref 11.5–15.5)
WBC: 7.9 10*3/uL (ref 4.0–10.5)

## 2023-05-30 LAB — URINALYSIS, ROUTINE W REFLEX MICROSCOPIC
Bilirubin Urine: NEGATIVE
Ketones, ur: NEGATIVE
Leukocytes,Ua: NEGATIVE
Nitrite: NEGATIVE
Specific Gravity, Urine: 1.025 (ref 1.000–1.030)
Total Protein, Urine: 30 — AB
Urine Glucose: NEGATIVE
Urobilinogen, UA: 0.2 (ref 0.0–1.0)
pH: 6 (ref 5.0–8.0)

## 2023-05-30 LAB — COMPREHENSIVE METABOLIC PANEL
ALT: 21 U/L (ref 0–35)
AST: 17 U/L (ref 0–37)
Albumin: 4.3 g/dL (ref 3.5–5.2)
Alkaline Phosphatase: 72 U/L (ref 39–117)
BUN: 11 mg/dL (ref 6–23)
CO2: 30 meq/L (ref 19–32)
Calcium: 9.2 mg/dL (ref 8.4–10.5)
Chloride: 100 meq/L (ref 96–112)
Creatinine, Ser: 0.71 mg/dL (ref 0.40–1.20)
GFR: 109.37 mL/min (ref >60.00)
Glucose, Bld: 102 mg/dL — ABNORMAL HIGH (ref 70–99)
Potassium: 4.1 meq/L (ref 3.5–5.1)
Sodium: 137 meq/L (ref 135–145)
Total Bilirubin: 0.5 mg/dL (ref 0.2–1.2)
Total Protein: 7.2 g/dL (ref 6.0–8.3)

## 2023-05-30 LAB — HEMOGLOBIN A1C: Hgb A1c MFr Bld: 5.7 % (ref 4.6–6.5)

## 2023-05-30 NOTE — Addendum Note (Signed)
 Addended by: Warden Fillers on: 05/30/2023 12:44 PM   Modules accepted: Orders

## 2023-05-30 NOTE — Progress Notes (Signed)
 Pre op labs ordered

## 2023-05-30 NOTE — H&P (Signed)
 Hannah Snow is a 38 y.o. female here for Mirena remove and replace and follow up sono . History of Present Illness Hannah Snow is a 38 year old female who presents for IUD removal and replacement due to bleeding issues.   She is experiencing heavy menstrual bleeding, which has been persistent and problematic, leading to the decision to remove her current ParaGard IUD and replace it with a Mirena IUD.   An ultrasound was performed prior to this visit, which did not reveal any concerning findings that could be causing the bleeding issues.     Past Medical History:  has a past medical history of Anxiety.  Past Surgical History:  has a past surgical history that includes Cesarean section (2008, 2016). Family History: family history includes Alzheimer's disease in her paternal grandmother; Breast cancer in her maternal grandmother and paternal grandmother; Coronary Artery Disease (Blocked arteries around heart) in her father, paternal grandmother, and paternal uncle; Depression in her brother, sister, and sister; Diabetes type II in her father, paternal grandmother, and paternal uncle; Heart disease in her paternal grandfather; High blood pressure (Hypertension) in her father, paternal grandmother, and paternal uncle; Hyperlipidemia (Elevated cholesterol) in her father; No Known Problems in her daughter, daughter, maternal grandfather, and mother. Social History:  reports that she has been smoking cigarettes. She has never used smokeless tobacco. She reports current alcohol use of about 7.0 standard drinks of alcohol per week. She reports that she does not use drugs. OB/GYN History:  OB History       Gravida  2   Para  2   Term  2   Preterm      AB      Living  2        SAB      IAB      Ectopic      Molar      Multiple      Live Births  2             Allergies: is allergic to latex and nickel. Medications:  Current Medications    Current Outpatient  Medications:    cyanocobalamin (VITAMIN B12) 1,000 mcg/mL injection, 1 mL monthly, Disp: , Rfl:    ferrous sulfate 325 (65 FE) MG tablet, Take 325 mg by mouth daily with breakfast, Disp: , Rfl:    omeprazole (PRILOSEC) 20 MG DR capsule, TAKE 2 CAPSULES (40 MG TOTAL) BY MOUTH ONCE DAILY, Disp: 60 capsule, Rfl: 2   sertraline (ZOLOFT) 25 MG tablet, Take 37.5 mg by mouth once daily, Disp: , Rfl:    copper intrauterine contraceptive (PARAGARD T) IUD, Insert 1 each into the uterus once 2016   (Patient not taking: Reported on 05/29/2023), Disp: , Rfl:    sertraline (ZOLOFT) 25 MG tablet, Take 1.5 tablets (37.5 mg total) by mouth once daily, Disp: 135 tablet, Rfl: 3   tranexamic acid (LYSTEDA) 650 mg tablet, Take 2 tablets (1,300 mg total) by mouth 3 (three) times daily Take for a maximum of 5 days during monthly menstruation. (Patient not taking: Reported on 05/29/2023), Disp: 30 tablet, Rfl: 3   Current Facility-Administered Medications:    levonorgestreL (MIRENA 52 mg) IUD 1 each, 1 each, Intrauterine, Once, Leyland Kenna, Sabas Sous, MD     Review of Systems: General:                      No fatigue or weight loss Eyes:  No vision changes Ears:                            No hearing difficulty Respiratory:                No cough or shortness of breath Pulmonary:                  No asthma or shortness of breath Cardiovascular:           No chest pain, palpitations, dyspnea on exertion Gastrointestinal:          No abdominal bloating, chronic diarrhea, constipations, masses, pain or hematochezia Genitourinary:             No hematuria, dysuria, abnormal vaginal discharge, pelvic pain, Menometrorrhagia Lymphatic:                   No swollen lymph nodes Musculoskeletal:No muscle weakness Neurologic:                  No extremity weakness, syncope, seizure disorder Psychiatric:                  No history of depression, delusions or suicidal/homicidal ideation      Exam:        Vitals:    05/29/23 0911  BP: (!) 153/98  Pulse: 80      Body mass index is 28.41 kg/m.   WDWN white/  female in NAD   Lungs: CTA  CV : RRR without murmur   Breast: exam done in sitting and lying position : No dimpling or retraction, no dominant mass, no spontaneous discharge, no axillary adenopathy Neck:  no thyromegaly Abdomen: soft , no mass, normal active bowel sounds,  non-tender, no rebound tenderness Pelvic: tanner stage 5 ,  External genitalia: vulva /labia no lesions Urethra: no prolapse Vagina: normal physiologic d/c Cervix: no lesions, no cervical motion tenderness   Uterus: normal size shape and contour, non-tender Adnexa: no mass,  non-tender   Rectovaginal: U/s: Uterus ======   Visualized. Size 97 mm x 52 mm x 41 mm Normal Size Position: anteverted Malformations: none Myometrium: appears normal Endometrium: could not be seen clearly secondary to the presence of an IUCD. Endometrial thickness, total 15.7 mm Cervix details: nabothian cysts visualized No fibroids identified No polyps identified Abnormally positioned IUCD, see impression 3D reconstruction and manipulation was required to evaluate the uterus in the coronal plane, which reveals an IUCD that is malpositioned within the cervix tilted to the left    Right Ovary =========   Visualized, Normal. Outline: Normal. Morphology: Appropriate. Size 33 mm x 28 mm x 19 mm Follicles identified Cyst(s)    Size 25.0 mm x 16.0 mm x 24 mm. Mean 21.7 mm. Vol 5.027 cm. Simple cyst   Left Ovary ========   Visualized, Normal. Outline: Normal. Morphology: Appropriate. Size 33 mm x 22 mm x 19 mm No cysts identified Follicles identified   Cul de Sac =========   Normal. No Fluid Seen   . Consent signed ; IUD strings seen and ring forcep used to remove IUD Paragard . One arm of the Paragard did not come out with the rest of the IUD    Attempted to remove the are with Randall stone forceps x3 -  unsuccessful Impression:  Retained portion of IUD  with removal        Plan:    Assessment & Plan Menorrhagia  Retained part of Paragard IUD . Reviewed u/s with ultrasoundographer , no indication the arm was imbedded , just malpositioned  Recommend H/S in Or with retrieval and placement of Mirena  Start Micronor qd , condoms for 2 weeks  Benefits and risks to surgery: The proposed benefit of the surgery has been discussed with the patient. The possible risks include, but are not limited to: organ injury to the bowel , bladder, ureters, and major blood vessels and nerves. There is a possibility of additional surgeries resulting from these injuries. There is also the risk of blood transfusion and the need to receive blood products during or after the procedure which may rarely lead to HIV or Hepatitis C infection. There is a risk of developing a deep venous thrombosis or a pulmonary embolism . There is the possibility of wound infection and also anesthetic complications, even the rare possibility of death. The patient understands these risks and wishes to proceed. All questions have been answered and the consent has been signed.   25 min in patient care        No follow-ups on file.   Vilma Prader, MD

## 2023-05-31 ENCOUNTER — Ambulatory Visit: Payer: 59 | Admitting: Internal Medicine

## 2023-05-31 ENCOUNTER — Encounter: Payer: Self-pay | Admitting: Internal Medicine

## 2023-05-31 ENCOUNTER — Encounter
Admission: RE | Admit: 2023-05-31 | Discharge: 2023-05-31 | Disposition: A | Discharge status: Home / Self Care | Payer: 59 | Source: Ambulatory Visit | Attending: Obstetrics and Gynecology | Admitting: Obstetrics and Gynecology

## 2023-05-31 ENCOUNTER — Other Ambulatory Visit: Payer: Self-pay

## 2023-05-31 VITALS — BP 130/84 | HR 76 | Ht 66.0 in | Wt 174.6 lb

## 2023-05-31 DIAGNOSIS — Z720 Tobacco use: Secondary | ICD-10-CM

## 2023-05-31 DIAGNOSIS — Z01818 Encounter for other preprocedural examination: Secondary | ICD-10-CM

## 2023-05-31 DIAGNOSIS — Z0000 Encounter for general adult medical examination without abnormal findings: Secondary | ICD-10-CM | POA: Insufficient documentation

## 2023-05-31 HISTORY — DX: Tobacco use: Z72.0

## 2023-05-31 NOTE — Patient Instructions (Addendum)
 Your procedure is scheduled on: Friday February 28  Report to the Registration Desk on the 1st floor of the [facility] To find out your arrival time, please call [phone] between 1PM - 3PM [id number] February 27 If your arrival time is 6:00 am, do not arrive before that time as the [facility] entrance doors do not open until 6:00 am.  REMEMBER: Instructions that are not followed completely may result in serious medical risk, up to and including death; or upon the discretion of your surgeon and anesthesiologist your surgery may need to be rescheduled.  Do not eat food after midnight the night before surgery.  No gum chewing or hard candies.  You may however, drink CLEAR liquids up to 2 hours before you are scheduled to arrive for your surgery. Do not drink anything within 2 hours of your scheduled arrival time.  Clear liquids include: - water  - apple juice without pulp - gatorade (not RED colors) - black coffee or tea (Do NOT add milk or creamers to the coffee or tea) Do NOT drink anything that is not on this list.  In addition, your doctor has ordered for you to drink the provided:  Ensure Pre-Surgery Clear Carbohydrate Drink   Drinking this carbohydrate drink up to two hours before surgery helps to reduce insulin resistance and improve patient outcomes. Please complete drinking 2 hours before scheduled arrival time.  One week prior to surgery: Start immediately  Stop Anti-inflammatories (NSAIDS) such as Advil, Aleve, Ibuprofen, Motrin, Naproxen, Naprosyn and Aspirin based products such as Excedrin, Goody's Powder, BC Powder. Stop ANY OVER THE COUNTER supplements until after surgery. cyanocobalamin (VITAMIN B12)  ferrous sulfate 325 (65 FE)  Simethicone   Continue taking all of your other prescription medications up until the day of surgery.  ON THE DAY OF SURGERY ONLY TAKE THESE MEDICATIONS WITH SIPS OF WATER:  omeprazole (PRILOSEC)  metoprolol tartrate (LOPRESSOR)  if needed for PSVTs  No Alcohol for 24 hours before or after surgery.  No Smoking including e-cigarettes for 24 hours before surgery.  No chewable tobacco products for at least 6 hours before surgery.  No nicotine patches on the day of surgery.  Do not use any "recreational" drugs for at least a week (preferably 2 weeks) before your surgery.  Please be advised that the combination of cocaine and anesthesia may have negative outcomes, up to and including death. If you test positive for cocaine, your surgery will be cancelled.  On the morning of surgery brush your teeth with toothpaste and water, you may rinse your mouth with mouthwash if you wish. Do not swallow any toothpaste or mouthwash.  Do not wear jewelry, make-up, hairpins, clips or nail polish.  For welded (permanent) jewelry: bracelets, anklets, waist bands, etc.  Please have this removed prior to surgery.  If it is not removed, there is a chance that hospital personnel will need to cut it off on the day of surgery.  Do not wear lotions, powders, or perfumes.   Do not shave body hair from the neck down 48 hours before surgery.  Contact lenses, hearing aids and dentures may not be worn into surgery.  Do not bring valuables to the hospital. [facility] is not responsible for any missing/lost belongings or valuables.   Notify your doctor if there is any change in your medical condition (cold, fever, infection).  Wear comfortable clothing (specific to your surgery type) to the hospital.  After surgery, you can help prevent lung complications  by doing breathing exercises.  Take deep breaths and cough every 1-2 hours.   If you are being discharged the day of surgery, you will not be allowed to drive home. You will need a responsible individual to drive you home and stay with you for 24 hours after surgery.   If you are taking public transportation, you will need to have a responsible individual with you.  Please call the  Pre-admissions Testing Dept. at [phone] if you have any questions about these instructions.  Surgery Visitation Policy:  Patients having surgery or a procedure may have two visitors.  Children under the age of 67 must have an adult with them who is not the patient.  Temporary Visitor Restrictions Due to increasing cases of flu, RSV and COVID-19: Children ages 59 and under will not be able to visit patients in [facility] hospitals under most circumstances.

## 2023-05-31 NOTE — Assessment & Plan Note (Signed)
 She is contemplating quitting when her form of contraception is changed to the mini pill.

## 2023-05-31 NOTE — Assessment & Plan Note (Signed)
 I have ordered and reviewed a 12 lead EKG and find that there are no acute changes and patient is in sinus rhythm.   Patient  is considered to be at low risk  For perioperative complications  Based on today's exam and history.  Baseline lytes,  hgb and ekg done.   Lab Results  Component Value Date   WBC 7.9 05/30/2023   HGB 13.7 05/30/2023   HCT 40.9 05/30/2023   MCV 95.2 05/30/2023   PLT 381.0 05/30/2023   Lab Results  Component Value Date   CREATININE 0.71 05/30/2023   Lab Results  Component Value Date   HGBA1C 5.7 05/30/2023

## 2023-05-31 NOTE — Progress Notes (Signed)
 Subjective:  Patient ID: Hannah Snow, female    DOB: 1985-08-19  Age: 38 y.o. MRN: [id number]  CC: The primary encounter diagnosis was Pre-op exam. Diagnoses of Tobacco abuse and Preoperative evaluation to rule out surgical contraindication were also pertinent to this visit.   HPI Hannah Snow presents for  Chief Complaint  Patient presents with   Pre-op Exam   38 yr old female with need for surgical clearance for removal of IUD by Dr Feliberto Gottron on Feb 28  after attempted removal during office visit was incomplete.  She has no history of anesthesia related events.  .   She is a smoker but denies chest pain , shortness of breath,  fevers,  and cough .  She has no history of diabetes.       Outpatient Medications Prior to Visit  Medication Sig Dispense Refill   cyanocobalamin (VITAMIN B12) 1000 MCG/ML injection Inject 1 mL (1,000 mcg total) into the muscle every 30 (thirty) days. 10 mL 0   ferrous sulfate 325 (65 FE) MG EC tablet Take 325 mg by mouth every other day.     ibuprofen (ADVIL) 800 MG tablet Take 1 tablet (800 mg total) by mouth every 8 (eight) hours as needed for Pain 30 tablet 1   metoprolol tartrate (LOPRESSOR) 25 MG tablet Take 12.5 mg by mouth as needed (for rapid heart rate.).     mupirocin ointment (BACTROBAN) 2 % Apply 1 Application topically 2 (two) times daily. To nasal septum (Patient taking differently: Apply 1 Application topically 2 (two) times daily as needed (nasal sores). To nasal septum) 22 g 0   omeprazole (PRILOSEC) 20 MG capsule Take 1 capsule (20 mg total) by mouth 2 (two) times daily as needed. (Patient taking differently: Take 40 mg by mouth daily as needed (heartburn).) 180 capsule 3   ondansetron (ZOFRAN) 8 MG tablet Take 1 tablet (8 mg total) by mouth every 8 (eight) hours as needed for Nausea 30 tablet 0   PARAGARD INTRAUTERINE COPPER IUD IUD 1 each by Intrauterine route once.     Probiotic Product (PROBIOTIC PO) Take 1 capsule by mouth  3 (three) times a week.     sertraline (ZOLOFT) 25 MG tablet Take 1.5 tablets (37.5 mg total) by mouth daily. (Patient taking differently: Take 37.5 mg by mouth daily as needed (depression).) 135 tablet 3   Simethicone 125 MG CAPS Take 2 capsules by mouth as needed (gas).     Syringe/Needle, Disp, (SYRINGE 3CC/25GX1") 25G X 1" 3 ML MISC Use for b12 injections 50 each 0   norethindrone (MICRONOR) 0.35 MG tablet Take 1 tablet (0.35 mg total) by mouth once daily (Patient not taking: Reported on 05/31/2023) 30 tablet 11   celecoxib (CELEBREX) 200 MG capsule Take 1 capsule (200 mg total) by mouth 2 (two) times daily. (Patient not taking: Reported on 05/30/2023) 60 capsule 0   cyclobenzaprine (FLEXERIL) 10 MG tablet Take 1 tablet (10 mg total) by mouth 3 (three) times daily as needed for muscle spasms. (Patient not taking: Reported on 05/30/2023) 30 tablet 0   No facility-administered medications prior to visit.    Review of Systems;  Patient denies headache, fevers, malaise, unintentional weight loss, skin rash, eye pain, sinus congestion and sinus pain, sore throat, dysphagia,  hemoptysis , cough, dyspnea, wheezing, chest pain, palpitations, orthopnea, edema, abdominal pain, nausea, melena, diarrhea, constipation, flank pain, dysuria, hematuria, urinary  Frequency, nocturia, numbness, tingling, seizures,  Focal weakness, Loss of consciousness,  Tremor,  insomnia, depression, anxiety, and suicidal ideation.      Objective:  BP 130/84   Pulse 76   Ht 5\' 6"  (1.676 m)   Wt 174 lb 9.6 oz (79.2 kg)   SpO2 97%   BMI 28.18 kg/m   BP Readings from Last 3 Encounters:  05/31/23 130/84  09/29/22 116/80  06/27/22 124/76    Wt Readings from Last 3 Encounters:  05/31/23 174 lb 9.6 oz (79.2 kg)  09/29/22 168 lb 12.8 oz (76.6 kg)  06/27/22 167 lb 9.6 oz (76 kg)    Physical Exam Vitals reviewed.  Constitutional:      General: She is not in acute distress.    Appearance: Normal appearance. She is  normal weight. She is not ill-appearing, toxic-appearing or diaphoretic.  HENT:     Head: Normocephalic.  Eyes:     General: No scleral icterus.       Right eye: No discharge.        Left eye: No discharge.     Conjunctiva/sclera: Conjunctivae normal.  Cardiovascular:     Rate and Rhythm: Normal rate and regular rhythm.     Heart sounds: Normal heart sounds.  Pulmonary:     Effort: Pulmonary effort is normal. No respiratory distress.     Breath sounds: Normal breath sounds.  Musculoskeletal:        General: Normal range of motion.  Skin:    General: Skin is warm and dry.  Neurological:     General: No focal deficit present.     Mental Status: She is alert and oriented to person, place, and time. Mental status is at baseline.  Psychiatric:        Mood and Affect: Mood normal.        Behavior: Behavior normal.        Thought Content: Thought content normal.        Judgment: Judgment normal.    Lab Results  Component Value Date   HGBA1C 5.7 05/30/2023   HGBA1C 6.2 01/06/2022    Lab Results  Component Value Date   CREATININE 0.71 05/30/2023   CREATININE 0.73 02/07/2023   CREATININE 0.74 01/06/2022    Lab Results  Component Value Date   WBC 7.9 05/30/2023   HGB 13.7 05/30/2023   HCT 40.9 05/30/2023   PLT 381.0 05/30/2023   GLUCOSE 102 (H) 05/30/2023   CHOL 175 01/06/2022   TRIG 104.0 01/06/2022   HDL 64.70 01/06/2022   LDLCALC 89 01/06/2022   ALT 21 05/30/2023   AST 17 05/30/2023   NA 137 05/30/2023   K 4.1 05/30/2023   CL 100 05/30/2023   CREATININE 0.71 05/30/2023   BUN 11 05/30/2023   CO2 30 05/30/2023   TSH 1.89 01/06/2022   HGBA1C 5.7 05/30/2023    US Abdomen Limited RUQ (LIVER/GB) Result Date: 02/10/2023 CLINICAL DATA:  Recurrent right upper quadrant pain. EXAM: ULTRASOUND ABDOMEN LIMITED RIGHT UPPER QUADRANT COMPARISON:  CT 06/27/2022.  Ultrasound 2022. FINDINGS: Gallbladder: Previous cholecystectomy. Common bile duct: Diameter: 3 mm Liver: No focal  lesion identified. Within normal limits in parenchymal echogenicity. Portal vein is patent on color Doppler imaging with normal direction of blood flow towards the liver. Other: None. IMPRESSION: Previous cholecystectomy.  No ductal dilatation. Electronically Signed   By: Karen Kays M.D.   On: 02/10/2023 15:29    Assessment & Plan:  .Pre-op exam -     EKG 12-Lead  Tobacco abuse Assessment & Plan: She is contemplating quitting when her form  of contraception is changed to the mini pill.    Preoperative evaluation to rule out surgical contraindication Assessment & Plan: I have ordered and reviewed a 12 lead EKG and find that there are no acute changes and patient is in sinus rhythm.   Patient  is considered to be at low risk  For perioperative complications  Based on today's exam and history.  Baseline lytes,  hgb and ekg done.   Lab Results  Component Value Date   WBC 7.9 05/30/2023   HGB 13.7 05/30/2023   HCT 40.9 05/30/2023   MCV 95.2 05/30/2023   PLT 381.0 05/30/2023   Lab Results  Component Value Date   CREATININE 0.71 05/30/2023   Lab Results  Component Value Date   HGBA1C 5.7 05/30/2023        Follow-up: No follow-ups on file.   Sherlene Shams, MD

## 2023-06-01 MED ORDER — LACTATED RINGERS IV SOLN: INTRAVENOUS | Status: DC

## 2023-06-01 MED ORDER — ORAL CARE MOUTH RINSE: 15.0000 mL | Freq: Once | OROMUCOSAL | Status: AC

## 2023-06-01 MED ORDER — CEFAZOLIN SODIUM-DEXTROSE 2-4 GM/100ML-% IV SOLN
2.0000 g | Freq: Once | INTRAVENOUS | Status: AC
  Administered 2023-06-02: 2 g via INTRAVENOUS

## 2023-06-01 MED ORDER — GABAPENTIN 300 MG PO CAPS
300.0000 mg | ORAL_CAPSULE | ORAL | Status: AC
  Administered 2023-06-02: 300 mg via ORAL

## 2023-06-01 MED ORDER — CHLORHEXIDINE GLUCONATE 0.12 % MT SOLN
15.0000 mL | Freq: Once | OROMUCOSAL | Status: AC
  Administered 2023-06-02: 15 mL via OROMUCOSAL

## 2023-06-02 ENCOUNTER — Encounter: Payer: Self-pay | Admitting: Obstetrics and Gynecology

## 2023-06-02 ENCOUNTER — Other Ambulatory Visit: Payer: Self-pay

## 2023-06-02 ENCOUNTER — Ambulatory Visit: Payer: 59 | Admitting: Certified Registered"

## 2023-06-02 ENCOUNTER — Encounter
Admission: RE | Disposition: A | Discharge status: Home / Self Care | Payer: Self-pay | Source: Home / Self Care | Attending: Obstetrics and Gynecology

## 2023-06-02 ENCOUNTER — Ambulatory Visit
Admission: RE | Admit: 2023-06-02 | Discharge: 2023-06-02 | Disposition: A | Discharge status: Home / Self Care | Payer: 59 | Attending: Obstetrics and Gynecology | Admitting: Obstetrics and Gynecology

## 2023-06-02 DIAGNOSIS — Z01818 Encounter for other preprocedural examination: Secondary | ICD-10-CM

## 2023-06-02 DIAGNOSIS — F419 Anxiety disorder, unspecified: Secondary | ICD-10-CM | POA: Diagnosis not present

## 2023-06-02 DIAGNOSIS — N840 Polyp of corpus uteri: Secondary | ICD-10-CM | POA: Insufficient documentation

## 2023-06-02 DIAGNOSIS — Z79899 Other long term (current) drug therapy: Secondary | ICD-10-CM | POA: Insufficient documentation

## 2023-06-02 DIAGNOSIS — F17210 Nicotine dependence, cigarettes, uncomplicated: Secondary | ICD-10-CM | POA: Diagnosis not present

## 2023-06-02 DIAGNOSIS — Z30430 Encounter for insertion of intrauterine contraceptive device: Secondary | ICD-10-CM | POA: Diagnosis not present

## 2023-06-02 DIAGNOSIS — N920 Excessive and frequent menstruation with regular cycle: Secondary | ICD-10-CM | POA: Insufficient documentation

## 2023-06-02 DIAGNOSIS — K219 Gastro-esophageal reflux disease without esophagitis: Secondary | ICD-10-CM | POA: Diagnosis not present

## 2023-06-02 HISTORY — PX: INTRAUTERINE DEVICE (IUD) INSERTION: SHX5877

## 2023-06-02 HISTORY — PX: HYSTEROSCOPY: SHX211

## 2023-06-02 LAB — POCT PREGNANCY, URINE: Preg Test, Ur: NEGATIVE

## 2023-06-02 SURGERY — HYSTEROSCOPY
Anesthesia: General

## 2023-06-02 MED ORDER — MIDAZOLAM HCL 2 MG/2ML IJ SOLN
INTRAMUSCULAR | Status: AC
  Filled 2023-06-02: qty 2

## 2023-06-02 MED ORDER — FENTANYL CITRATE (PF) 100 MCG/2ML IJ SOLN
INTRAMUSCULAR | Status: DC | PRN
  Administered 2023-06-02 (×2): 25 ug via INTRAVENOUS
  Administered 2023-06-02: 50 ug via INTRAVENOUS

## 2023-06-02 MED ORDER — LIDOCAINE HCL (CARDIAC) PF 100 MG/5ML IV SOSY
PREFILLED_SYRINGE | INTRAVENOUS | Status: DC | PRN
  Administered 2023-06-02: 100 mg via INTRAVENOUS

## 2023-06-02 MED ORDER — ONDANSETRON HCL 4 MG/2ML IJ SOLN
INTRAMUSCULAR | Status: DC | PRN
  Administered 2023-06-02 (×2): 4 mg via INTRAVENOUS

## 2023-06-02 MED ORDER — ACETAMINOPHEN 10 MG/ML IV SOLN
INTRAVENOUS | Status: AC
  Filled 2023-06-02: qty 100

## 2023-06-02 MED ORDER — GLYCOPYRROLATE 0.2 MG/ML IJ SOLN
INTRAMUSCULAR | Status: DC | PRN
  Administered 2023-06-02: .2 mg via INTRAVENOUS

## 2023-06-02 MED ORDER — FENTANYL CITRATE (PF) 100 MCG/2ML IJ SOLN
INTRAMUSCULAR | Status: AC
  Filled 2023-06-02: qty 2

## 2023-06-02 MED ORDER — ACETAMINOPHEN 10 MG/ML IV SOLN
INTRAVENOUS | Status: DC | PRN
  Administered 2023-06-02: 1000 mg via INTRAVENOUS

## 2023-06-02 MED ORDER — MONSELS FERRIC SUBSULFATE EX SOLN
CUTANEOUS | Status: AC
  Filled 2023-06-02: qty 8

## 2023-06-02 MED ORDER — KETOROLAC TROMETHAMINE 30 MG/ML IJ SOLN
INTRAMUSCULAR | Status: DC | PRN
  Administered 2023-06-02: 30 mg via INTRAVENOUS

## 2023-06-02 MED ORDER — DEXAMETHASONE SODIUM PHOSPHATE 10 MG/ML IJ SOLN
INTRAMUSCULAR | Status: DC | PRN
  Administered 2023-06-02: 10 mg via INTRAVENOUS

## 2023-06-02 MED ORDER — LEVONORGESTREL 20 MCG/DAY IU IUD
INTRAUTERINE_SYSTEM | INTRAUTERINE | Status: AC
  Filled 2023-06-02: qty 1

## 2023-06-02 MED ORDER — CEFAZOLIN SODIUM-DEXTROSE 2-4 GM/100ML-% IV SOLN
INTRAVENOUS | Status: AC
  Filled 2023-06-02: qty 100

## 2023-06-02 MED ORDER — DEXMEDETOMIDINE HCL IN NACL 200 MCG/50ML IV SOLN
INTRAVENOUS | Status: DC | PRN
Start: 2023-06-02 — End: 2023-06-02
  Administered 2023-06-02: 16 ug via INTRAVENOUS
  Administered 2023-06-02: 4 ug via INTRAVENOUS

## 2023-06-02 MED ORDER — PROPOFOL 10 MG/ML IV BOLUS
INTRAVENOUS | Status: DC | PRN
  Administered 2023-06-02: 250 mg via INTRAVENOUS

## 2023-06-02 MED ORDER — POVIDONE-IODINE 10 % EX SWAB
2.0000 | Freq: Once | CUTANEOUS | Status: AC
  Administered 2023-06-02: 2 via TOPICAL

## 2023-06-02 MED ORDER — MIDAZOLAM HCL 2 MG/2ML IJ SOLN
INTRAMUSCULAR | Status: DC | PRN
  Administered 2023-06-02: 2 mg via INTRAVENOUS

## 2023-06-02 MED ORDER — SILVER NITRATE-POT NITRATE 75-25 % EX MISC
CUTANEOUS | Status: AC
  Filled 2023-06-02: qty 10

## 2023-06-02 MED ORDER — GABAPENTIN 300 MG PO CAPS
ORAL_CAPSULE | ORAL | Status: AC
  Filled 2023-06-02: qty 1

## 2023-06-02 MED ORDER — CHLORHEXIDINE GLUCONATE 0.12 % MT SOLN
OROMUCOSAL | Status: AC
  Filled 2023-06-02: qty 15

## 2023-06-02 SURGICAL SUPPLY — 28 items
APPLICATOR SWAB PROCTO LG 16IN (MISCELLANEOUS) ×1 IMPLANT
BAG PRESSURE INF REUSE 1000 (BAG) ×1 IMPLANT
DEVICE MYOSURE LITE (MISCELLANEOUS) IMPLANT
DEVICE MYOSURE REACH (MISCELLANEOUS) IMPLANT
DRSG TELFA 3X8 NADH STRL (GAUZE/BANDAGES/DRESSINGS) ×1 IMPLANT
GLOVE SURG SYN 8.0 (GLOVE) ×1 IMPLANT
GLOVE SURG SYN 8.0 PF PI (GLOVE) ×1 IMPLANT
GOWN STRL REUS W/ TWL LRG LVL3 (GOWN DISPOSABLE) ×1 IMPLANT
GOWN STRL REUS W/ TWL XL LVL3 (GOWN DISPOSABLE) ×1 IMPLANT
IV NS 1000ML BAXH (IV SOLUTION) ×1 IMPLANT
IV NS IRRIG 3000ML ARTHROMATIC (IV SOLUTION) ×1 IMPLANT
KIT PROCEDURE FLUENT (KITS) IMPLANT
KIT TURNOVER CYSTO (KITS) ×1 IMPLANT
MANIFOLD NEPTUNE II (INSTRUMENTS) ×1 IMPLANT
PACK DNC HYST (MISCELLANEOUS) ×1 IMPLANT
PAD OB MATERNITY 11 LF (PERSONAL CARE ITEMS) ×1 IMPLANT
PAD PREP OB/GYN DISP 24X41 (PERSONAL CARE ITEMS) ×1 IMPLANT
SCRUB CHG 4% DYNA-HEX 4OZ (MISCELLANEOUS) ×1 IMPLANT
SEAL ROD LENS SCOPE MYOSURE (ABLATOR) ×1 IMPLANT
SET CYSTO W/LG BORE CLAMP LF (SET/KITS/TRAYS/PACK) IMPLANT
SOL PREP PVP 2OZ (MISCELLANEOUS) ×1 IMPLANT
SOLUTION PREP PVP 2OZ (MISCELLANEOUS) ×1 IMPLANT
SYS MIRENA INTRAUTERINE (MISCELLANEOUS) ×1 IMPLANT
SYSTEM MIRENA INTRAUTERINE (MISCELLANEOUS) IMPLANT
TOWEL OR 17X26 4PK STRL BLUE (TOWEL DISPOSABLE) ×1 IMPLANT
TRAP FLUID SMOKE EVACUATOR (MISCELLANEOUS) ×1 IMPLANT
TUBING CONNECTING 10 (TUBING) IMPLANT
WATER STERILE IRR 500ML POUR (IV SOLUTION) ×1 IMPLANT

## 2023-06-02 NOTE — Anesthesia Procedure Notes (Signed)
 Procedure Name: LMA Insertion Date/Time: 06/02/2023 12:08 PM  Performed by: Mohammed Kindle, CRNAPre-anesthesia Checklist: Patient identified, Emergency Drugs available, Suction available and Patient being monitored Patient Re-evaluated:Patient Re-evaluated prior to induction Oxygen Delivery Method: Circle system utilized Preoxygenation: Pre-oxygenation with 100% oxygen Induction Type: IV induction LMA: LMA inserted LMA Size: 4.0 Placement Confirmation: positive ETCO2 and breath sounds checked- equal and bilateral Tube secured with: Tape Dental Injury: Teeth and Oropharynx as per pre-operative assessment

## 2023-06-02 NOTE — Op Note (Signed)
 Hannah Snow, Hannah Snow MEDICAL RECORD NO: [id number] ACCOUNT NO: [id number] DATE OF BIRTH: 1985/12/27 FACILITY: [facility] LOCATION: [facility] PHYSICIAN: Suzy Bouchard, MD  Operative Report   DATE OF PROCEDURE: 06/02/2023  PREOPERATIVE DIAGNOSES: 1.  Retained fragment of intrauterine device. 2.  Menorrhagia.  POSTOPERATIVE DIAGNOSES: 1.  Retained fragment of intrauterine device. 2.  Menorrhagia. 3.  Endometrial polyps.  PROCEDURE: 1.  Hysteroscopic evaluation of endometrial cavity. 2.  Hysteroscopic resection of endometrial polyps with MyoSure. 3.  Placement of Mirena IUD.  SURGEON:  Suzy Bouchard, MD  ANESTHESIA:  General endotracheal anesthesia.  INDICATIONS:  A 38 year old gravida 2, para 2 patient with a Paragard IUD that was removed in the office 3 days prior with one of the ParaGard arms not coming out with the rest of the intrauterine device.  The patient has had a history of menorrhagia and  has not had adequate control of this.  DESCRIPTION OF PROCEDURE:  After adequate general endotracheal anesthesia, the patient was placed in the dorsal supine position.  The legs in the candy cane stirrups.  The patient's abdomen, perineum, and vagina were prepped and draped in normal sterile  fashion.  A timeout was performed.  The patient did receive 2 grams of IV Ancef prior to commencement of the case.  A weighted speculum was placed in the posterior vaginal vault and the anterior cervix was grasped with a single-tooth tenaculum.  The  cervix was then dilated to #15 Hanks dilator without difficulty.  Uterus sounded to 9 cm.  The 5 mm hysteroscope was advanced into the endometrial cavity.  Initial evaluation revealed fluffy endometrium.  No evidence of the Paragard IUD arm.  There were  a few endometrial polyps noted at the fundal anterior portion of the uterus.  The MyoSure LITE resection instrument was brought up to the operative field and these polyps were  resected.  Pictures were taken.  Again, no evidence of IUD fragment noted  after resection.  The hysteroscope was removed and the Mirena IUD was placed without difficulty with the strings trimmed to 3 cm.  Good hemostasis noted.  There were no complications.  ESTIMATED BLOOD LOSS:  10 mL.  INTRAOPERATIVE FLUIDS:  400 mL.  URINE OUTPUT:  100 mL.  Straight catheterization at the beginning of the case.  The patient did receive 30 mg intravenous Toradol at the end of the procedure.  The patient was taken to the recovery room in good condition.   VAI D: 06/02/2023 1:35:16 pm T: 06/02/2023 8:28:00 pm  JOB: [id number]/ [id number]

## 2023-06-02 NOTE — Brief Op Note (Signed)
 06/02/2023  1:02 PM  PATIENT:  Hannah Snow  38 y.o. female  PRE-OPERATIVE DIAGNOSIS:  retained part of IUD, menorrhagia  POST-OPERATIVE DIAGNOSIS:  retained part of IUD, menorrhagia ENDOMETRIAL POLYPS  PROCEDURE:  Procedure(s): HYSTEROSCOPY WITH POLYPECTOMY (N/A) INTRAUTERINE DEVICE (IUD) INSERTION, MIRENA (N/A)  SURGEON:  Surgeons and Role:    * Danylle Ouk, Ihor Austin, MD - Primary  PHYSICIAN ASSISTANT: cst  ASSISTANTS: none   ANESTHESIA:   general  EBL:  15 mL   BLOOD ADMINISTERED:none  DRAINS: none   LOCAL MEDICATIONS USED:  NONE  SPECIMEN:  Source of Specimen:  ENDOMETRIAL POLYPS   DISPOSITION OF SPECIMEN:  PATHOLOGY  COUNTS:  YES  TOURNIQUET:  * No tourniquets in log *  DICTATION: .Other Dictation: Dictation Number VERBAL   PLAN OF CARE: Discharge to home after PACU  PATIENT DISPOSITION:  PACU - hemodynamically stable.   Delay start of Pharmacological VTE agent (>24hrs) due to surgical blood loss or risk of bleeding: not applicable

## 2023-06-02 NOTE — Anesthesia Postprocedure Evaluation (Signed)
 Anesthesia Post Note  Patient: Hannah Snow  Procedure(s) Performed: HYSTEROSCOPY WITH POLYPECTOMY INTRAUTERINE DEVICE (IUD) INSERTION, MIRENA  Patient location during evaluation: PACU Anesthesia Type: General Level of consciousness: awake and awake and alert Pain management: satisfactory to patient Vital Signs Assessment: post-procedure vital signs reviewed and stable Respiratory status: spontaneous breathing Cardiovascular status: blood pressure returned to baseline Anesthetic complications: no   No notable events documented.   Last Vitals:  Vitals:   06/02/23 1142 06/02/23 1314  BP: (!) 148/94 (!) 126/90  Pulse: 78 75  Resp:  16  Temp:  36.5 C  SpO2:  99%    Last Pain:  Vitals:   06/02/23 1314  TempSrc:   PainSc: 0-No pain                 VAN STAVEREN,Xiamara Hulet

## 2023-06-02 NOTE — Progress Notes (Signed)
 Pt ready for h/s retrieval of IUD fragment and replacement of Mirena IUD .  Labs reviewed . All questions answered . Proceed

## 2023-06-02 NOTE — Anesthesia Preprocedure Evaluation (Addendum)
 Anesthesia Evaluation  Patient identified by MRN, date of birth, ID band Patient awake    Reviewed: Allergy & Precautions, NPO status , Patient's Chart, lab work & pertinent test results  Airway Mallampati: II  TM Distance: >3 FB Neck ROM: full    Dental  (+) Teeth Intact   Pulmonary neg pulmonary ROS, Current Smoker and Patient abstained from smoking.   Pulmonary exam normal        Cardiovascular Exercise Tolerance: Good negative cardio ROS Normal cardiovascular exam Rhythm:Regular Rate:Normal     Neuro/Psych  Headaches  Anxiety     negative neurological ROS  negative psych ROS   GI/Hepatic negative GI ROS, Neg liver ROS,GERD  Medicated,,  Endo/Other  negative endocrine ROS    Renal/GU negative Renal ROS     Musculoskeletal   Abdominal Normal abdominal exam  (+)   Peds negative pediatric ROS (+)  Hematology negative hematology ROS (+)   Anesthesia Other Findings Past Medical History: No date: Anemia No date: Anxiety No date: Depression No date: GERD (gastroesophageal reflux disease) No date: History of kidney stones     Comment:  H/O 04/16/2021: PSVT (paroxysmal supraventricular tachycardia) (HCC) 09/29/2022: Strain of rhomboid muscle 05/31/2023: Tobacco abuse  Past Surgical History: No date: CESAREAN SECTION 02/02/2015: CESAREAN SECTION; N/A     Comment:  Procedure: CESAREAN SECTION;  Surgeon: Suzy Bouchard, MD;  Location: [facility] ORS;  Service:               Obstetrics;  Laterality: N/A; No date: CHOLECYSTECTOMY, LAPAROSCOPIC     Comment:  History of laparoscopic cholecystectomy 05/2020 , Dr               Aleen Campi  BMI    Body Mass Index: 28.11 kg/m      Reproductive/Obstetrics negative OB ROS                             Anesthesia Physical Anesthesia Plan  ASA: 2  Anesthesia Plan: General   Post-op Pain Management:    Induction:  Intravenous  PONV Risk Score and Plan: 1 and Dexamethasone, Ondansetron, Midazolam and Treatment may vary due to age or medical condition  Airway Management Planned: LMA  Additional Equipment:   Intra-op Plan:   Post-operative Plan: Extubation in OR  Informed Consent: I have reviewed the patients History and Physical, chart, labs and discussed the procedure including the risks, benefits and alternatives for the proposed anesthesia with the patient or authorized representative who has indicated his/her understanding and acceptance.     Dental Advisory Given  Plan Discussed with: CRNA  Anesthesia Plan Comments:        Anesthesia Quick Evaluation

## 2023-06-02 NOTE — Progress Notes (Signed)
 Patient had question about taking her mini pill.  Verified with Dr. Feliberto Gottron that he does want her to start taking it.  Also she should not take any motrin until 7:00p.m.

## 2023-06-02 NOTE — Transfer of Care (Signed)
 Immediate Anesthesia Transfer of Care Note  Patient: Hannah Snow  Procedure(s) Performed: HYSTEROSCOPY WITH POLYPECTOMY INTRAUTERINE DEVICE (IUD) INSERTION, MIRENA  Patient Location: PACU  Anesthesia Type:General  Level of Consciousness: awake, drowsy, and patient cooperative  Airway & Oxygen Therapy: Patient Spontanous Breathing  Post-op Assessment: Report given to RN and Post -op Vital signs reviewed and stable  Post vital signs: Reviewed and stable  Last Vitals:  Vitals Value Taken Time  BP 126/90 06/02/23 1312  Temp    Pulse 103 06/02/23 1315  Resp 16 06/02/23 1310  SpO2 98 % 06/02/23 1315  Vitals shown include unfiled device data.  Last Pain:  Vitals:   06/02/23 1112  TempSrc: Temporal  PainSc: 0-No pain         Complications: No notable events documented.

## 2023-06-05 ENCOUNTER — Encounter: Payer: Self-pay | Admitting: Obstetrics and Gynecology

## 2023-06-06 ENCOUNTER — Encounter: Payer: Self-pay | Admitting: Internal Medicine

## 2023-06-06 LAB — SURGICAL PATHOLOGY

## 2023-06-19 DIAGNOSIS — N840 Polyp of corpus uteri: Secondary | ICD-10-CM | POA: Diagnosis not present

## 2023-06-19 DIAGNOSIS — Z975 Presence of (intrauterine) contraceptive device: Secondary | ICD-10-CM | POA: Diagnosis not present

## 2023-07-25 DIAGNOSIS — Z30431 Encounter for routine checking of intrauterine contraceptive device: Secondary | ICD-10-CM | POA: Diagnosis not present

## 2023-07-25 DIAGNOSIS — T8332XA Displacement of intrauterine contraceptive device, initial encounter: Secondary | ICD-10-CM | POA: Diagnosis not present

## 2023-07-26 ENCOUNTER — Other Ambulatory Visit: Payer: Self-pay

## 2023-07-26 MED FILL — Cyanocobalamin Inj 1000 MCG/ML: INTRAMUSCULAR | 90 days supply | Qty: 3 | Fill #1 | Status: AC

## 2023-07-27 ENCOUNTER — Other Ambulatory Visit: Payer: Self-pay | Admitting: Internal Medicine

## 2023-07-27 ENCOUNTER — Other Ambulatory Visit: Payer: Self-pay

## 2023-07-27 MED ORDER — PREDNISONE 10 MG PO TABS
ORAL_TABLET | ORAL | 0 refills | Status: DC
  Filled 2023-07-27: qty 21, 6d supply, fill #0

## 2023-07-27 MED ORDER — FLUTICASONE PROPIONATE 50 MCG/ACT NA SUSP
2.0000 | Freq: Every day | NASAL | 6 refills | Status: DC
  Filled 2023-07-27: qty 16, 30d supply, fill #0

## 2023-07-27 MED ORDER — AMOXICILLIN-POT CLAVULANATE 875-125 MG PO TABS
1.0000 | ORAL_TABLET | Freq: Two times a day (BID) | ORAL | 0 refills | Status: DC
Start: 2023-07-27 — End: 2023-11-28
  Filled 2023-07-27: qty 14, 7d supply, fill #0

## 2023-09-28 ENCOUNTER — Other Ambulatory Visit: Payer: Self-pay

## 2023-09-28 MED FILL — Cyanocobalamin Inj 1000 MCG/ML: INTRAMUSCULAR | 90 days supply | Qty: 3 | Fill #2 | Status: CN

## 2023-10-09 MED FILL — Cyanocobalamin Inj 1000 MCG/ML: INTRAMUSCULAR | 90 days supply | Qty: 3 | Fill #2 | Status: CN

## 2023-10-23 ENCOUNTER — Other Ambulatory Visit: Payer: Self-pay

## 2023-10-26 ENCOUNTER — Other Ambulatory Visit: Payer: Self-pay

## 2023-10-26 MED FILL — Cyanocobalamin Inj 1000 MCG/ML: INTRAMUSCULAR | 90 days supply | Qty: 3 | Fill #2 | Status: AC

## 2023-11-28 ENCOUNTER — Encounter: Payer: Self-pay | Admitting: Internal Medicine

## 2023-11-28 ENCOUNTER — Ambulatory Visit (INDEPENDENT_AMBULATORY_CARE_PROVIDER_SITE_OTHER): Admitting: Internal Medicine

## 2023-11-28 ENCOUNTER — Other Ambulatory Visit: Payer: Self-pay

## 2023-11-28 ENCOUNTER — Other Ambulatory Visit (INDEPENDENT_AMBULATORY_CARE_PROVIDER_SITE_OTHER)

## 2023-11-28 VITALS — BP 110/84 | HR 87 | Temp 98.3°F | Ht 66.0 in | Wt 177.6 lb

## 2023-11-28 DIAGNOSIS — R635 Abnormal weight gain: Secondary | ICD-10-CM | POA: Diagnosis not present

## 2023-11-28 DIAGNOSIS — D649 Anemia, unspecified: Secondary | ICD-10-CM

## 2023-11-28 DIAGNOSIS — R7301 Impaired fasting glucose: Secondary | ICD-10-CM | POA: Diagnosis not present

## 2023-11-28 DIAGNOSIS — R5383 Other fatigue: Secondary | ICD-10-CM | POA: Diagnosis not present

## 2023-11-28 DIAGNOSIS — Z124 Encounter for screening for malignant neoplasm of cervix: Secondary | ICD-10-CM

## 2023-11-28 DIAGNOSIS — Z720 Tobacco use: Secondary | ICD-10-CM

## 2023-11-28 DIAGNOSIS — Z0000 Encounter for general adult medical examination without abnormal findings: Secondary | ICD-10-CM | POA: Diagnosis not present

## 2023-11-28 LAB — LIPID PANEL
Cholesterol: 163 mg/dL (ref 0–200)
HDL: 51.1 mg/dL (ref >39.00)
LDL Cholesterol: 93 mg/dL (ref 0–99)
NonHDL: 111.58
Total CHOL/HDL Ratio: 3
Triglycerides: 94 mg/dL (ref 0.0–149.0)
VLDL: 18.8 mg/dL (ref 0.0–40.0)

## 2023-11-28 LAB — HEMOGLOBIN A1C: Hgb A1c MFr Bld: 6.2 % (ref 4.6–6.5)

## 2023-11-28 LAB — CBC WITH DIFFERENTIAL/PLATELET
Basophils Absolute: 0 K/uL (ref 0.0–0.1)
Basophils Relative: 0.4 % (ref 0.0–3.0)
Eosinophils Absolute: 0.1 K/uL (ref 0.0–0.7)
Eosinophils Relative: 1.6 % (ref 0.0–5.0)
HCT: 42.9 % (ref 36.0–46.0)
Hemoglobin: 14.1 g/dL (ref 12.0–15.0)
Lymphocytes Relative: 24.8 % (ref 12.0–46.0)
Lymphs Abs: 1.5 K/uL (ref 0.7–4.0)
MCHC: 32.9 g/dL (ref 30.0–36.0)
MCV: 92.2 fl (ref 78.0–100.0)
Monocytes Absolute: 0.4 K/uL (ref 0.1–1.0)
Monocytes Relative: 7.1 % (ref 3.0–12.0)
Neutro Abs: 4.1 K/uL (ref 1.4–7.7)
Neutrophils Relative %: 66.1 % (ref 43.0–77.0)
Platelets: 312 K/uL (ref 150.0–400.0)
RBC: 4.65 Mil/uL (ref 3.87–5.11)
RDW: 14.4 % (ref 11.5–15.5)
WBC: 6.2 K/uL (ref 4.0–10.5)

## 2023-11-28 LAB — COMPREHENSIVE METABOLIC PANEL WITH GFR
ALT: 21 U/L (ref 0–35)
AST: 16 U/L (ref 0–37)
Albumin: 4 g/dL (ref 3.5–5.2)
Alkaline Phosphatase: 61 U/L (ref 39–117)
BUN: 12 mg/dL (ref 6–23)
CO2: 26 meq/L (ref 19–32)
Calcium: 9 mg/dL (ref 8.4–10.5)
Chloride: 103 meq/L (ref 96–112)
Creatinine, Ser: 0.67 mg/dL (ref 0.40–1.20)
GFR: 112.04 mL/min (ref >60.00)
Glucose, Bld: 106 mg/dL — ABNORMAL HIGH (ref 70–99)
Potassium: 4.5 meq/L (ref 3.5–5.1)
Sodium: 137 meq/L (ref 135–145)
Total Bilirubin: 0.4 mg/dL (ref 0.2–1.2)
Total Protein: 6.6 g/dL (ref 6.0–8.3)

## 2023-11-28 LAB — TSH: TSH: 2.04 u[IU]/mL (ref 0.35–5.50)

## 2023-11-28 LAB — LDL CHOLESTEROL, DIRECT: Direct LDL: 105 mg/dL

## 2023-11-28 MED ORDER — CITALOPRAM HYDROBROMIDE 10 MG PO TABS
10.0000 mg | ORAL_TABLET | Freq: Every day | ORAL | 1 refills | Status: AC
Start: 2023-11-28 — End: ?
  Filled 2023-11-28: qty 90, 90d supply, fill #0

## 2023-11-28 NOTE — Assessment & Plan Note (Signed)
 Reviewed the barriers to quitting ,  untreated anxiety secondary to familial stressors .  Trial of citalopram .  Chantix discussed once the anxiety is under control

## 2023-11-28 NOTE — Progress Notes (Unsigned)
 Patient ID: Hannah Snow, female    DOB: 23-Mar-1986  Age: 38 y.o. MRN: [id number]  The patient is here for annual preventive examination and management of other chronic and acute problems.   The risk factors are reflected in the social history.   The roster of all physicians providing medical care to patient - is listed in the Snapshot section of the chart.   Activities of daily living:  The patient is 100% independent in all ADLs: dressing, toileting, feeding as well as independent mobility   Home safety : The patient has smoke detectors in the home. They wear seatbelts.  There are no unsecured firearms at home. There is no violence in the home.    There is no risks for hepatitis, STDs or HIV. There is no   history of blood transfusion. They have no travel history to infectious disease endemic areas of the world.   The patient has seen their dentist in the last six month. They have seen their eye doctor in the last year. The patinet  denies slight hearing difficulty with regard to whispered voices and some television programs.  They have deferred audiologic testing in the last year.  They do not  have excessive sun exposure. Discussed the need for sun protection: hats, long sleeves and use of sunscreen if there is significant sun exposure.    Diet: the importance of a healthy diet is discussed. They do have a healthy diet.   The benefits of regular aerobic exercise were discussed. The patient  exercises  3 to 5 days per week  for  60 minutes.    Depression screen: there are no signs or vegative symptoms of depression- irritability, change in appetite, anhedonia, sadness/tearfullness.   The following portions of the patient's history were reviewed and updated as appropriate: allergies, current medications, past family history, past medical history,  past surgical history, past social history  and problem list.   Visual acuity was not assessed per patient preference since the patient has  regular follow up with an  ophthalmologist. Hearing and body mass index were assessed and reviewed.    During the course of the visit the patient was educated and counseled about appropriate screening and preventive services including : fall prevention , diabetes screening, nutrition counseling, colorectal cancer screening, and recommended immunizations.    Chief Complaint:   needs gyn referral fo follow up and IUD removal placed   Weight gain due to unhealthy lifestyle   smoking , not exercising,  drinking 1 to 6 beers per night.  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    Physical Exam:  BP 110/84 (BP Location: Left Arm, Patient Position: Sitting, Cuff Size: Normal)   Pulse 87   Temp 98.3 F (36.8 C) (Oral)   Ht 5' 6 (1.676 m)   Wt 177 lb 9.6 oz (80.6 kg)   SpO2 99%   BMI 28.67 kg/m    Physical Exam Vitals reviewed.  Constitutional:      General: She is not in acute distress.  Appearance: Normal appearance. She is normal weight. She is not ill-appearing, toxic-appearing or diaphoretic.  HENT:     Head: Normocephalic.  Eyes:     General: No scleral icterus.       Right eye: No discharge.        Left eye: No discharge.     Conjunctiva/sclera: Conjunctivae normal.  Cardiovascular:     Rate and Rhythm: Normal rate and regular rhythm.     Heart sounds: Normal heart sounds.  Pulmonary:     Effort: Pulmonary effort is normal. No  respiratory distress.     Breath sounds: Normal breath sounds.  Musculoskeletal:        General: Normal range of motion.  Skin:    General: Skin is warm and dry.  Neurological:     General: No focal deficit present.     Mental Status: She is alert and oriented to person, place, and time. Mental status is at baseline.  Psychiatric:        Mood and Affect: Mood normal.        Behavior: Behavior normal.        Thought Content: Thought content normal.        Judgment: Judgment normal.     Assessment and Plan: Cervical cancer screening -     Ambulatory referral to Obstetrics / Gynecology  Tobacco abuse Assessment & Plan: Reviewed the barriers to quitting ,  untreated anxiety secondary to familial stressors .  Trial of citalopram .  Chantix discussed once the anxiety is under control    Encounter for preventive health examination Assessment & Plan: age appropriate education and counseling updated, referrals for preventative services and immunizations addressed, dietary and smoking counseling addressed, most recent labs reviewed.  I have personally reviewed and have noted:   1) the patient's medical and social history 2) The pt's use of alcohol, tobacco, and illicit drugs 3) The patient's current medications and supplements 4) Functional ability including ADL's, fall risk, home safety risk, hearing and visual impairment 5) Diet and physical activities 6) Evidence for depression or mood disorder 7) The patient's height, weight, and BMI have been recorded in the chart   I have made referrals, and provided counseling and education based on review of the above    Other orders -     Citalopram  Hydrobromide; Take 1 tablet (10 mg total) by mouth daily.  Dispense: 90 tablet; Refill: 1    No follow-ups on file.  Verneita LITTIE Kettering, MD

## 2023-11-29 NOTE — Assessment & Plan Note (Signed)

## 2024-01-05 ENCOUNTER — Other Ambulatory Visit: Payer: Self-pay | Admitting: Internal Medicine

## 2024-01-05 ENCOUNTER — Other Ambulatory Visit: Payer: Self-pay

## 2024-01-05 MED ORDER — AMOXICILLIN-POT CLAVULANATE 875-125 MG PO TABS
1.0000 | ORAL_TABLET | Freq: Two times a day (BID) | ORAL | 0 refills | Status: DC
  Filled 2024-01-05: qty 14, 7d supply, fill #0

## 2024-01-05 MED ORDER — PREDNISONE 10 MG PO TABS
ORAL_TABLET | ORAL | 0 refills | Status: AC
  Filled 2024-01-05: qty 21, 6d supply, fill #0

## 2024-01-15 NOTE — Progress Notes (Unsigned)
 Marylynn Verneita CROME, MD   No chief complaint on file.   HPI:      [name] is a 38 y.o. G2P1002 whose LMP was No LMP recorded. (Menstrual status: IUD)., presents today for NP pap smear and IUD f/u.  Neg pap/neg HPV DNA 8/22;  S/p hysteroscopy with polypectomy 2/25 with Mirena  placement with Dr. Lovetta.   07/25/23 NOTE FROM [facility]: Ultrasound showed a small fragment of the previous [Paragard] IUD embedded in the myometrium, not expected to cause complications. New IUD slightly displaced but in acceptable position. Bleeding improving, intercourse functional. Stability anticipated in three months.    Patient Active Problem List   Diagnosis Date Noted   Tobacco abuse 05/31/2023   Encounter for preventive health examination 05/31/2023   Strain of rhomboid muscle 09/29/2022   RLQ abdominal pain 06/27/2022   S/P cholecystectomy 01/15/2022   Iron deficiency 01/15/2022   PSVT (paroxysmal supraventricular tachycardia) 04/16/2021   Light headedness 04/16/2021   Headache 04/16/2021    Past Surgical History:  Procedure Laterality Date   CESAREAN SECTION     CESAREAN SECTION N/A 02/02/2015   Procedure: CESAREAN SECTION;  Surgeon: Debby JINNY Lovetta, MD;  Location: [facility];  Service: Obstetrics;  Laterality: N/A;   CHOLECYSTECTOMY, LAPAROSCOPIC     History of laparoscopic cholecystectomy 05/2020 , Dr Desiderio   HYSTEROSCOPY N/A 06/02/2023   Procedure: HYSTEROSCOPY WITH POLYPECTOMY;  Surgeon: Lovetta Debby JINNY, MD;  Location: [facility];  Service: Gynecology;  Laterality: N/A;   INTRAUTERINE DEVICE (IUD) INSERTION N/A 06/02/2023   Procedure: INTRAUTERINE DEVICE (IUD) INSERTION, MIRENA ;  Surgeon: Schermerhorn, Debby JINNY, MD;  Location: [facility];  Service: Gynecology;  Laterality: N/A;    Family History  Problem Relation Age of Onset   Hyperlipidemia Father    Hypertension Father    Mental illness Father    Diabetes Father    Heart disease Father        stent     Mental illness Sister    Mental illness Brother    Breast cancer Maternal Grandmother    Lung cancer Maternal Grandmother    Breast cancer Paternal Grandmother    Mental illness Sister    Hyperlipidemia Mother     Social History   Socioeconomic History   Marital status: Married    Spouse name: Web designer   Number of children: Not on file   Years of education: Not on file   Highest education level: Not on file  Occupational History   Not on file  Tobacco Use   Smoking status: Every Day    Current packs/day: 0.50    Average packs/day: 0.5 packs/day for 15.0 years (7.5 ttl pk-yrs)    Types: Cigarettes   Smokeless tobacco: Never  Vaping Use   Vaping status: Never Used  Substance and Sexual Activity   Alcohol use: Yes    Comment: 2 BEERS DAILY   Drug use: No   Sexual activity: Yes    Partners: Male    Birth control/protection: I.U.D.  Other Topics Concern   Not on file  Social History Narrative   Not on file   Social Drivers of Health   Financial Resource Strain: Not on file  Food Insecurity: Not on file  Transportation Needs: Not on file  Physical Activity: Not on file  Stress: Not on file  Social Connections: Not on file  Intimate Partner Violence: Not on file    Outpatient Medications Prior to Visit  Medication Sig Dispense Refill  amoxicillin -clavulanate (AUGMENTIN ) 875-125 MG tablet Take 1 tablet by mouth 2 (two) times daily. 14 tablet 0   citalopram  (CELEXA ) 10 MG tablet Take 1 tablet (10 mg total) by mouth daily. 90 tablet 1   cyanocobalamin  (VITAMIN B12) 1000 MCG/ML injection Inject 1 mL (1,000 mcg total) into the muscle every 30 (thirty) days. 10 mL 0   ferrous sulfate 325 (65 FE) MG EC tablet Take 325 mg by mouth every other day. (Patient not taking: Reported on 11/28/2023)     levonorgestrel  (MIRENA ) 20 MCG/DAY IUD 1 each by Intrauterine route.     metoprolol  tartrate (LOPRESSOR ) 25 MG tablet Take 12.5 mg by mouth as needed (for rapid heart rate.). (Patient  not taking: Reported on 11/28/2023)     omeprazole  (PRILOSEC) 20 MG capsule Take 1 capsule (20 mg total) by mouth 2 (two) times daily as needed. 180 capsule 3   Probiotic Product (PROBIOTIC PO) Take 1 capsule by mouth 3 (three) times a week.     Simethicone  125 MG CAPS Take 2 capsules by mouth as needed (gas).     Syringe/Needle, Disp, (SYRINGE 3CC/25GX1) 25G X 1 3 ML MISC Use for b12 injections 50 each 0   No facility-administered medications prior to visit.      ROS:  Review of Systems BREAST: No symptoms   OBJECTIVE:   Vitals:  There were no vitals taken for this visit.  Physical Exam  Results: No results found for this or any previous visit (from the past 24 hours).   Assessment/Plan: No diagnosis found.    No orders of the defined types were placed in this encounter.     No follow-ups on file.  Zen Cedillos B. Yannely Kintzel, PA-C 01/15/2024 3:12 PM

## 2024-01-16 ENCOUNTER — Other Ambulatory Visit (HOSPITAL_COMMUNITY)
Admission: RE | Admit: 2024-01-16 | Discharge: 2024-01-16 | Disposition: A | Discharge status: Home / Self Care | Source: Ambulatory Visit | Attending: Obstetrics and Gynecology | Admitting: Obstetrics and Gynecology

## 2024-01-16 ENCOUNTER — Ambulatory Visit: Admitting: Obstetrics and Gynecology

## 2024-01-16 ENCOUNTER — Encounter: Payer: Self-pay | Admitting: Obstetrics and Gynecology

## 2024-01-16 VITALS — BP 137/87 | HR 82 | Ht 66.0 in | Wt 180.0 lb

## 2024-01-16 DIAGNOSIS — Z124 Encounter for screening for malignant neoplasm of cervix: Secondary | ICD-10-CM

## 2024-01-16 DIAGNOSIS — Z1151 Encounter for screening for human papillomavirus (HPV): Secondary | ICD-10-CM

## 2024-01-16 DIAGNOSIS — Z30431 Encounter for routine checking of intrauterine contraceptive device: Secondary | ICD-10-CM | POA: Diagnosis not present

## 2024-01-16 DIAGNOSIS — N939 Abnormal uterine and vaginal bleeding, unspecified: Secondary | ICD-10-CM | POA: Diagnosis not present

## 2024-01-16 NOTE — Patient Instructions (Addendum)
I value your feedback and you entrusting [place] with your care. If you get a [place] patient survey, I would appreciate you taking the time to let us know about your experience today. Thank you!  Centralized scheduling at [facility] [phone], option #3

## 2024-01-17 LAB — CYTOLOGY - PAP
Comment: NEGATIVE
Diagnosis: NEGATIVE
High risk HPV: NEGATIVE

## 2024-02-01 ENCOUNTER — Other Ambulatory Visit

## 2024-02-01 ENCOUNTER — Telehealth: Payer: Self-pay | Admitting: Obstetrics and Gynecology

## 2024-02-01 NOTE — Telephone Encounter (Signed)
 Reached out to pt about pelvic US  that was scheduled on 02/01/2024 at 8:15.  Left message for pt to call back.

## 2024-02-02 ENCOUNTER — Encounter: Payer: Self-pay | Admitting: Obstetrics and Gynecology

## 2024-02-02 NOTE — Telephone Encounter (Signed)
 Reached out to pt (2x) about pelvic US  that was scheduled on 02/01/2024 at 8:15.  Left message for pt to call back.  Will send a MyChart letter to pt.

## 2024-02-15 ENCOUNTER — Ambulatory Visit (INDEPENDENT_AMBULATORY_CARE_PROVIDER_SITE_OTHER)

## 2024-02-15 DIAGNOSIS — Z30431 Encounter for routine checking of intrauterine contraceptive device: Secondary | ICD-10-CM

## 2024-02-15 DIAGNOSIS — N939 Abnormal uterine and vaginal bleeding, unspecified: Secondary | ICD-10-CM | POA: Diagnosis not present

## 2024-02-22 ENCOUNTER — Other Ambulatory Visit: Payer: Self-pay

## 2024-02-22 MED FILL — Cyanocobalamin Inj 1000 MCG/ML: INTRAMUSCULAR | 30 days supply | Qty: 1 | Fill #3 | Status: AC

## 2024-02-28 ENCOUNTER — Other Ambulatory Visit: Payer: Self-pay

## 2024-03-14 ENCOUNTER — Telehealth: Payer: Self-pay

## 2024-03-14 ENCOUNTER — Ambulatory Visit
Admission: RE | Admit: 2024-03-14 | Discharge: 2024-03-14 | Disposition: A | Discharge status: Home / Self Care | Source: Ambulatory Visit | Attending: Internal Medicine | Admitting: Internal Medicine

## 2024-03-14 ENCOUNTER — Ambulatory Visit (INDEPENDENT_AMBULATORY_CARE_PROVIDER_SITE_OTHER)

## 2024-03-14 ENCOUNTER — Ambulatory Visit: Payer: Self-pay | Admitting: Internal Medicine

## 2024-03-14 ENCOUNTER — Other Ambulatory Visit (INDEPENDENT_AMBULATORY_CARE_PROVIDER_SITE_OTHER): Admitting: Internal Medicine

## 2024-03-14 ENCOUNTER — Other Ambulatory Visit: Payer: Self-pay | Admitting: Internal Medicine

## 2024-03-14 ENCOUNTER — Ambulatory Visit
Admission: RE | Admit: 2024-03-14 | Discharge: 2024-03-14 | Disposition: A | Discharge status: Home / Self Care | Attending: Internal Medicine | Admitting: Internal Medicine

## 2024-03-14 DIAGNOSIS — R079 Chest pain, unspecified: Secondary | ICD-10-CM

## 2024-03-14 DIAGNOSIS — R030 Elevated blood-pressure reading, without diagnosis of hypertension: Secondary | ICD-10-CM

## 2024-03-14 LAB — CBC WITH DIFFERENTIAL/PLATELET
Basophils Absolute: 0 K/uL (ref 0.0–0.1)
Basophils Relative: 0.5 % (ref 0.0–3.0)
Eosinophils Absolute: 0.1 K/uL (ref 0.0–0.7)
Eosinophils Relative: 1.2 % (ref 0.0–5.0)
HCT: 42.7 % (ref 36.0–46.0)
Hemoglobin: 14.7 g/dL (ref 12.0–15.0)
Lymphocytes Relative: 23.2 % (ref 12.0–46.0)
Lymphs Abs: 1.6 K/uL (ref 0.7–4.0)
MCHC: 34.5 g/dL (ref 30.0–36.0)
MCV: 94.8 fl (ref 78.0–100.0)
Monocytes Absolute: 0.5 K/uL (ref 0.1–1.0)
Monocytes Relative: 6.9 % (ref 3.0–12.0)
Neutro Abs: 4.6 K/uL (ref 1.4–7.7)
Neutrophils Relative %: 68.2 % (ref 43.0–77.0)
Platelets: 302 K/uL (ref 150.0–400.0)
RBC: 4.5 Mil/uL (ref 3.87–5.11)
RDW: 13.7 % (ref 11.5–15.5)
WBC: 6.8 K/uL (ref 4.0–10.5)

## 2024-03-14 LAB — COMPREHENSIVE METABOLIC PANEL WITH GFR
ALT: 18 U/L (ref 0–35)
AST: 17 U/L (ref 0–37)
Albumin: 4.3 g/dL (ref 3.5–5.2)
Alkaline Phosphatase: 57 U/L (ref 39–117)
BUN: 13 mg/dL (ref 6–23)
CO2: 30 meq/L (ref 19–32)
Calcium: 9.4 mg/dL (ref 8.4–10.5)
Chloride: 102 meq/L (ref 96–112)
Creatinine, Ser: 0.67 mg/dL (ref 0.40–1.20)
GFR: 111.81 mL/min (ref >60.00)
Glucose, Bld: 90 mg/dL (ref 70–99)
Potassium: 3.7 meq/L (ref 3.5–5.1)
Sodium: 137 meq/L (ref 135–145)
Total Bilirubin: 0.4 mg/dL (ref 0.2–1.2)
Total Protein: 6.8 g/dL (ref 6.0–8.3)

## 2024-03-14 LAB — MICROALBUMIN / CREATININE URINE RATIO
Creatinine,U: 140.4 mg/dL
Microalb Creat Ratio: 17.1 mg/g (ref 0.0–30.0)
Microalb, Ur: 2.4 mg/dL — ABNORMAL HIGH (ref 0.0–1.9)

## 2024-03-14 LAB — CK TOTAL AND CKMB (NOT AT ARMC)
CK-MB Index: 1.4 ng/mL (ref 0.0–5.3)
Total CK: 60 U/L (ref 32–182)

## 2024-03-14 LAB — TROPONIN I (HIGH SENSITIVITY): High Sens Troponin I: 3 ng/L (ref 2–17)

## 2024-03-14 LAB — POCT URINE PREGNANCY: Preg Test, Ur: NEGATIVE

## 2024-03-14 NOTE — Telephone Encounter (Signed)
 Received a phone call from [place] at [facility] to relay lab results for patient due to the orders being sent over STAT.  CK MB 1.4 CK Total 60 Please advise.   Sent to Dr. Glendia as doc of the day due to Dr. Marylynn being out of the office.

## 2024-03-14 NOTE — Progress Notes (Signed)
 EKG, POC Preganacy test and labs drawn in nurse visit per provider request.

## 2024-03-14 NOTE — Telephone Encounter (Signed)
 Patient complaining of elevated BP and chest discomfort.  Orders for EKG, labs and chest x-ray.  Patient scheduled for nurse visit to complete EKG.

## 2024-03-14 NOTE — Addendum Note (Signed)
 Addended by: SEBASTIAN KNEE on: 03/14/2024 11:35 AM   Modules accepted: Orders

## 2024-03-14 NOTE — Addendum Note (Signed)
 Addended by: Noam Franzen C on: 03/14/2024 11:32 AM   Modules accepted: Orders

## 2024-03-14 NOTE — Addendum Note (Signed)
 Addended by: SEBASTIAN KNEE on: 03/14/2024 11:41 AM   Modules accepted: Orders

## 2024-03-14 NOTE — Addendum Note (Signed)
 Addended by: SEBASTIAN KNEE on: 03/14/2024 10:15 AM   Modules accepted: Orders

## 2024-03-15 ENCOUNTER — Other Ambulatory Visit: Payer: Self-pay

## 2024-03-15 ENCOUNTER — Encounter: Payer: Self-pay | Admitting: Internal Medicine

## 2024-03-15 ENCOUNTER — Ambulatory Visit: Admitting: Internal Medicine

## 2024-03-15 ENCOUNTER — Other Ambulatory Visit: Payer: Self-pay | Admitting: Internal Medicine

## 2024-03-15 VITALS — BP 142/84 | HR 82 | Ht 66.0 in | Wt 179.6 lb

## 2024-03-15 DIAGNOSIS — R1012 Left upper quadrant pain: Secondary | ICD-10-CM

## 2024-03-15 DIAGNOSIS — Z9049 Acquired absence of other specified parts of digestive tract: Secondary | ICD-10-CM

## 2024-03-15 DIAGNOSIS — G8929 Other chronic pain: Secondary | ICD-10-CM | POA: Diagnosis not present

## 2024-03-15 DIAGNOSIS — Z720 Tobacco use: Secondary | ICD-10-CM | POA: Diagnosis not present

## 2024-03-15 DIAGNOSIS — R1011 Right upper quadrant pain: Secondary | ICD-10-CM | POA: Diagnosis not present

## 2024-03-15 DIAGNOSIS — I10 Essential (primary) hypertension: Secondary | ICD-10-CM | POA: Diagnosis not present

## 2024-03-15 DIAGNOSIS — R079 Chest pain, unspecified: Secondary | ICD-10-CM | POA: Diagnosis not present

## 2024-03-15 MED ORDER — LOSARTAN POTASSIUM 50 MG PO TABS
50.0000 mg | ORAL_TABLET | Freq: Every day | ORAL | 2 refills | Status: AC
  Filled 2024-03-15: qty 30, 30d supply, fill #0

## 2024-03-15 MED ORDER — OMEPRAZOLE 20 MG PO CPDR
20.0000 mg | DELAYED_RELEASE_CAPSULE | Freq: Two times a day (BID) | ORAL | 3 refills | Status: AC | PRN
  Filled 2024-03-15 (×2): qty 180, 90d supply, fill #0

## 2024-03-15 MED ORDER — CYANOCOBALAMIN 1000 MCG/ML IJ SOLN
1000.0000 ug | INTRAMUSCULAR | 0 refills | Status: AC
  Filled 2024-03-15 – 2024-03-18 (×3): qty 3, 90d supply, fill #0

## 2024-03-15 MED ORDER — ALPRAZOLAM 0.25 MG PO TABS
0.2500 mg | ORAL_TABLET | Freq: Two times a day (BID) | ORAL | 0 refills | Status: AC | PRN
  Filled 2024-03-15: qty 20, 10d supply, fill #0

## 2024-03-15 NOTE — Telephone Encounter (Signed)
 FYI.  spoke to [place] about her labs. Notified were wnl. CXR results pending.

## 2024-03-15 NOTE — Progress Notes (Signed)
 Subjective:  Patient ID: Hannah Snow, female    DOB: 10-25-1985  Age: 38 y.o. MRN: [id number]  CC: The primary encounter diagnosis was Chronic bilateral upper abdominal pain. Diagnoses of S/P laparoscopic cholecystectomy, Essential hypertension, Colicky RUQ abdominal pain, Tobacco abuse, and Chest pain at rest were also pertinent to this visit.   HPI Hannah Snow presents for  Chief Complaint  Patient presents with   Hypertension    5 day history of headache on crown and felt flushed BP was 140/92 . Repeat readings have been elevated  Headache has persisteed top of head and now with  chest pain , non palpable,  both sided of chest .  Since her lap chole in 2022 she has had bilateral  upper quadrant pain , and her most recent episodes of chest pain radiated to her left scapular area   New IUD placed in February  GAD: she was prescribed citalopram  in August but never started it.    Outpatient Medications Prior to Visit  Medication Sig Dispense Refill   cyanocobalamin  (VITAMIN B12) 1000 MCG/ML injection Inject 1 mL (1,000 mcg total) into the muscle every 30 (thirty) days. 10 mL 0   levonorgestrel  (MIRENA ) 20 MCG/DAY IUD 1 each by Intrauterine route.     metoprolol  tartrate (LOPRESSOR ) 25 MG tablet Take 12.5 mg by mouth as needed (for rapid heart rate.).     omeprazole  (PRILOSEC) 20 MG capsule Take 1 capsule (20 mg total) by mouth 2 (two) times daily as needed. 180 capsule 3   Simethicone  125 MG CAPS Take 2 capsules by mouth as needed (gas).     Syringe/Needle, Disp, (SYRINGE 3CC/25GX1) 25G X 1 3 ML MISC Use for b12 injections 50 each 0   citalopram  (CELEXA ) 10 MG tablet Take 1 tablet (10 mg total) by mouth daily. (Patient not taking: Reported on 03/15/2024) 90 tablet 1   Probiotic Product (PROBIOTIC PO) Take 1 capsule by mouth 3 (three) times a week. (Patient not taking: Reported on 03/15/2024)     amoxicillin -clavulanate (AUGMENTIN ) 875-125 MG tablet Take 1 tablet by mouth  2 (two) times daily. 14 tablet 0   No facility-administered medications prior to visit.    Review of Systems;  Patient denies headache, fevers, malaise, unintentional weight loss, skin rash, eye pain, sinus congestion and sinus pain, sore throat, dysphagia,  hemoptysis , cough, dyspnea, wheezing, chest pain, palpitations, orthopnea, edema, abdominal pain, nausea, melena, diarrhea, constipation, flank pain, dysuria, hematuria, urinary  Frequency, nocturia, numbness, tingling, seizures,  Focal weakness, Loss of consciousness,  Tremor, insomnia, depression, anxiety, and suicidal ideation.      Objective:  BP (!) 142/84   Pulse 82   Ht 5' 6 (1.676 m)   Wt 179 lb 9.6 oz (81.5 kg)   SpO2 98%   BMI 28.99 kg/m   BP Readings from Last 3 Encounters:  03/15/24 (!) 142/84  01/16/24 137/87  11/28/23 110/84    Wt Readings from Last 3 Encounters:  03/15/24 179 lb 9.6 oz (81.5 kg)  01/16/24 180 lb (81.6 kg)  11/28/23 177 lb 9.6 oz (80.6 kg)    Physical Exam Vitals reviewed.  Constitutional:      General: She is not in acute distress.    Appearance: Normal appearance. She is normal weight. She is not ill-appearing, toxic-appearing or diaphoretic.  HENT:     Head: Normocephalic.  Eyes:     General: No scleral icterus.       Right eye: No discharge.  Left eye: No discharge.     Conjunctiva/sclera: Conjunctivae normal.  Cardiovascular:     Rate and Rhythm: Normal rate and regular rhythm.     Heart sounds: Normal heart sounds.  Pulmonary:     Effort: Pulmonary effort is normal. No respiratory distress.     Breath sounds: Normal breath sounds.  Musculoskeletal:        General: Normal range of motion.  Skin:    General: Skin is warm and dry.  Neurological:     General: No focal deficit present.     Mental Status: She is alert and oriented to person, place, and time. Mental status is at baseline.  Psychiatric:        Mood and Affect: Mood normal.        Behavior: Behavior  normal.        Thought Content: Thought content normal.        Judgment: Judgment normal.     Lab Results  Component Value Date   HGBA1C 6.2 11/28/2023   HGBA1C 5.7 05/30/2023   HGBA1C 6.2 01/06/2022    Lab Results  Component Value Date   CREATININE 0.67 03/14/2024   CREATININE 0.67 11/28/2023   CREATININE 0.71 05/30/2023    Lab Results  Component Value Date   WBC 6.8 03/14/2024   HGB 14.7 03/14/2024   HCT 42.7 03/14/2024   PLT 302.0 03/14/2024   GLUCOSE 90 03/14/2024   CHOL 163 11/28/2023   TRIG 94.0 11/28/2023   HDL 51.10 11/28/2023   LDLDIRECT 105.0 11/28/2023   LDLCALC 93 11/28/2023   ALT 18 03/14/2024   AST 17 03/14/2024   NA 137 03/14/2024   K 3.7 03/14/2024   CL 102 03/14/2024   CREATININE 0.67 03/14/2024   BUN 13 03/14/2024   CO2 30 03/14/2024   TSH 2.04 11/28/2023   HGBA1C 6.2 11/28/2023   MICROALBUR 2.4 (H) 03/14/2024    No results found.  Assessment & Plan:  .Chronic bilateral upper abdominal pain -     MR ABDOMEN WITH MRCP W CONTRAST; Future  S/P laparoscopic cholecystectomy -     MR ABDOMEN WITH MRCP W CONTRAST; Future  Essential hypertension Assessment & Plan: Starting losartan  given present of early microalbuminuria  Lab Results  Component Value Date   MICROALBUR 2.4 (H) 03/14/2024        Colicky RUQ abdominal pain Assessment & Plan: Since her lap chole with occasional radation to left scapular area. MRCP ordered to rule out retained stone    Tobacco abuse Assessment & Plan: Reviewed the barriers to quitting ,  untreated anxiety secondary to familial stressors .  Trial of citalopram  was deferred   Chantix discussed once the anxiety is under control    Chest pain at rest Assessment & Plan: I have ordered and reviewed a 12 lead EKG and find that there are no acute changes and patient is in sinus rhythm.  Chest  xray and cardiac enzymes were also normal.     Other orders -     Losartan  Potassium; Take 1 tablet (50 mg  total) by mouth daily.  Dispense: 30 tablet; Refill: 2 -     ALPRAZolam ; Take 1 tablet (0.25 mg total) by mouth 2 (two) times daily as needed for anxiety.  Dispense: 20 tablet; Refill: 0    Follow-up: No follow-ups on file.   Hannah LITTIE Kettering, MD

## 2024-03-15 NOTE — Telephone Encounter (Signed)
 Noted

## 2024-03-17 DIAGNOSIS — R079 Chest pain, unspecified: Secondary | ICD-10-CM | POA: Insufficient documentation

## 2024-03-17 DIAGNOSIS — I10 Essential (primary) hypertension: Secondary | ICD-10-CM | POA: Insufficient documentation

## 2024-03-17 NOTE — Assessment & Plan Note (Signed)
 Starting losartan  given present of early microalbuminuria  Lab Results  Component Value Date   MICROALBUR 2.4 (H) 03/14/2024

## 2024-03-17 NOTE — Assessment & Plan Note (Signed)
 I have ordered and reviewed a 12 lead EKG and find that there are no acute changes and patient is in sinus rhythm.  Chest  xray and cardiac enzymes were also normal.

## 2024-03-17 NOTE — Assessment & Plan Note (Signed)
 Since her lap chole with occasional radation to left scapular area. MRCP ordered to rule out retained stone

## 2024-03-17 NOTE — Assessment & Plan Note (Signed)
 Reviewed the barriers to quitting ,  untreated anxiety secondary to familial stressors .  Trial of citalopram  was deferred   Chantix discussed once the anxiety is under control

## 2024-03-18 ENCOUNTER — Other Ambulatory Visit: Payer: Self-pay

## 2024-03-21 ENCOUNTER — Ambulatory Visit: Admission: RE | Admit: 2024-03-21 | Discharge: 2024-03-21 | Attending: Internal Medicine | Admitting: Internal Medicine

## 2024-03-21 DIAGNOSIS — R1011 Right upper quadrant pain: Secondary | ICD-10-CM | POA: Insufficient documentation

## 2024-03-21 DIAGNOSIS — Z9049 Acquired absence of other specified parts of digestive tract: Secondary | ICD-10-CM | POA: Diagnosis not present

## 2024-03-21 DIAGNOSIS — R1012 Left upper quadrant pain: Secondary | ICD-10-CM | POA: Insufficient documentation

## 2024-03-21 DIAGNOSIS — G8929 Other chronic pain: Secondary | ICD-10-CM | POA: Diagnosis not present

## 2024-03-21 DIAGNOSIS — R109 Unspecified abdominal pain: Secondary | ICD-10-CM | POA: Diagnosis not present

## 2024-03-21 MED ORDER — GADOBUTROL 1 MMOL/ML IV SOLN
8.0000 mL | Freq: Once | INTRAVENOUS | Status: AC | PRN
  Administered 2024-03-21: 17:00:00 8 mL via INTRAVENOUS

## 2024-03-22 ENCOUNTER — Other Ambulatory Visit: Payer: Self-pay | Admitting: Internal Medicine

## 2024-03-22 ENCOUNTER — Other Ambulatory Visit: Payer: Self-pay

## 2024-03-22 DIAGNOSIS — G8929 Other chronic pain: Secondary | ICD-10-CM

## 2024-03-22 DIAGNOSIS — Z9049 Acquired absence of other specified parts of digestive tract: Secondary | ICD-10-CM

## 2024-03-22 MED ORDER — PREDNISONE 10 MG PO TABS
ORAL_TABLET | ORAL | 0 refills | Status: AC
  Filled 2024-03-22: qty 21, 6d supply, fill #0

## 2024-03-26 ENCOUNTER — Ambulatory Visit: Payer: Self-pay | Admitting: Internal Medicine

## 2024-04-02 ENCOUNTER — Other Ambulatory Visit: Payer: Self-pay

## 2024-04-08 ENCOUNTER — Other Ambulatory Visit (INDEPENDENT_AMBULATORY_CARE_PROVIDER_SITE_OTHER)

## 2024-04-08 DIAGNOSIS — R6883 Chills (without fever): Secondary | ICD-10-CM

## 2024-04-08 LAB — POCT INFLUENZA A/B
Influenza A, POC: NEGATIVE
Influenza B, POC: NEGATIVE

## 2024-04-09 ENCOUNTER — Ambulatory Visit: Payer: Self-pay | Admitting: Internal Medicine
